# Patient Record
Sex: Female | Born: 1985 | Hispanic: No | Marital: Single | Smoking: Never smoker
Health system: Southern US, Community
[De-identification: ages and names within clinical notes are randomized; demographics above are authoritative.]

## PROBLEM LIST (undated history)

## (undated) DIAGNOSIS — C50919 Malignant neoplasm of unspecified site of unspecified female breast: Secondary | ICD-10-CM

## (undated) DIAGNOSIS — Z9011 Acquired absence of right breast and nipple: Secondary | ICD-10-CM

## (undated) HISTORY — PX: OTHER SURGICAL HISTORY: SHX169

---

## 2021-08-05 ENCOUNTER — Emergency Department (HOSPITAL_COMMUNITY): Payer: BC Managed Care – PPO

## 2021-08-05 ENCOUNTER — Other Ambulatory Visit: Payer: Self-pay

## 2021-08-05 ENCOUNTER — Inpatient Hospital Stay (HOSPITAL_COMMUNITY)
Admission: EM | Admit: 2021-08-05 | Discharge: 2021-08-06 | DRG: 054 | Disposition: A | Discharge status: Other Acute Inpatient Hospital | Payer: BC Managed Care – PPO | Attending: Internal Medicine | Admitting: Internal Medicine

## 2021-08-05 DIAGNOSIS — C7952 Secondary malignant neoplasm of bone marrow: Secondary | ICD-10-CM | POA: Diagnosis present

## 2021-08-05 DIAGNOSIS — C7931 Secondary malignant neoplasm of brain: Secondary | ICD-10-CM | POA: Diagnosis present

## 2021-08-05 DIAGNOSIS — Z79899 Other long term (current) drug therapy: Secondary | ICD-10-CM | POA: Diagnosis not present

## 2021-08-05 DIAGNOSIS — C7951 Secondary malignant neoplasm of bone: Secondary | ICD-10-CM | POA: Diagnosis present

## 2021-08-05 DIAGNOSIS — D61818 Other pancytopenia: Secondary | ICD-10-CM | POA: Diagnosis not present

## 2021-08-05 DIAGNOSIS — G9349 Other encephalopathy: Secondary | ICD-10-CM | POA: Diagnosis present

## 2021-08-05 DIAGNOSIS — C50919 Malignant neoplasm of unspecified site of unspecified female breast: Secondary | ICD-10-CM | POA: Diagnosis present

## 2021-08-05 DIAGNOSIS — E222 Syndrome of inappropriate secretion of antidiuretic hormone: Secondary | ICD-10-CM | POA: Diagnosis present

## 2021-08-05 DIAGNOSIS — R29810 Facial weakness: Secondary | ICD-10-CM | POA: Diagnosis present

## 2021-08-05 DIAGNOSIS — Z9011 Acquired absence of right breast and nipple: Secondary | ICD-10-CM

## 2021-08-05 DIAGNOSIS — Z7952 Long term (current) use of systemic steroids: Secondary | ICD-10-CM | POA: Diagnosis not present

## 2021-08-05 DIAGNOSIS — R4781 Slurred speech: Secondary | ICD-10-CM | POA: Diagnosis present

## 2021-08-05 DIAGNOSIS — Z20822 Contact with and (suspected) exposure to covid-19: Secondary | ICD-10-CM | POA: Diagnosis present

## 2021-08-05 DIAGNOSIS — G8194 Hemiplegia, unspecified affecting left nondominant side: Secondary | ICD-10-CM | POA: Diagnosis not present

## 2021-08-05 DIAGNOSIS — I639 Cerebral infarction, unspecified: Secondary | ICD-10-CM

## 2021-08-05 DIAGNOSIS — D61810 Antineoplastic chemotherapy induced pancytopenia: Secondary | ICD-10-CM | POA: Diagnosis present

## 2021-08-05 HISTORY — DX: Acquired absence of right breast and nipple: Z90.11

## 2021-08-05 HISTORY — DX: Malignant neoplasm of unspecified site of unspecified female breast: C50.919

## 2021-08-05 LAB — CBC
HCT: 25.1 % — ABNORMAL LOW (ref 36.0–46.0)
Hemoglobin: 8 g/dL — ABNORMAL LOW (ref 12.0–15.0)
MCH: 30.5 pg (ref 26.0–34.0)
MCHC: 31.9 g/dL (ref 30.0–36.0)
MCV: 95.8 fL (ref 80.0–100.0)
Platelets: 14 10*3/uL — CL (ref 150–400)
RBC: 2.62 MIL/uL — ABNORMAL LOW (ref 3.87–5.11)
RDW: 17.2 % — ABNORMAL HIGH (ref 11.5–15.5)
WBC: 1.5 10*3/uL — ABNORMAL LOW (ref 4.0–10.5)

## 2021-08-05 LAB — I-STAT BETA HCG BLOOD, ED (MC, WL, AP ONLY): I-stat hCG, quantitative: <5 m[IU]/mL (ref <5)

## 2021-08-05 LAB — COMPREHENSIVE METABOLIC PANEL
ALT: 130 U/L — ABNORMAL HIGH (ref 0–44)
AST: 129 U/L — ABNORMAL HIGH (ref 15–41)
Albumin: 3.2 g/dL — ABNORMAL LOW (ref 3.5–5.0)
Alkaline Phosphatase: 225 U/L — ABNORMAL HIGH (ref 38–126)
Anion gap: 11 (ref 5–15)
BUN: 5 mg/dL — ABNORMAL LOW (ref 6–20)
CO2: 23 mmol/L (ref 22–32)
Calcium: 8.2 mg/dL — ABNORMAL LOW (ref 8.9–10.3)
Chloride: 95 mmol/L — ABNORMAL LOW (ref 98–111)
Creatinine, Ser: 0.49 mg/dL (ref 0.44–1.00)
GFR, Estimated: >60 mL/min (ref >60)
Glucose, Bld: 115 mg/dL — ABNORMAL HIGH (ref 70–99)
Potassium: 3.4 mmol/L — ABNORMAL LOW (ref 3.5–5.1)
Sodium: 129 mmol/L — ABNORMAL LOW (ref 135–145)
Total Bilirubin: 0.9 mg/dL (ref 0.3–1.2)
Total Protein: 6.2 g/dL — ABNORMAL LOW (ref 6.5–8.1)

## 2021-08-05 LAB — I-STAT CHEM 8, ED
BUN: 5 mg/dL — ABNORMAL LOW (ref 6–20)
Calcium, Ion: 1.03 mmol/L — ABNORMAL LOW (ref 1.15–1.40)
Chloride: 93 mmol/L — ABNORMAL LOW (ref 98–111)
Creatinine, Ser: 0.3 mg/dL — ABNORMAL LOW (ref 0.44–1.00)
Glucose, Bld: 112 mg/dL — ABNORMAL HIGH (ref 70–99)
HCT: 25 % — ABNORMAL LOW (ref 36.0–46.0)
Hemoglobin: 8.5 g/dL — ABNORMAL LOW (ref 12.0–15.0)
Potassium: 3.6 mmol/L (ref 3.5–5.1)
Sodium: 130 mmol/L — ABNORMAL LOW (ref 135–145)
TCO2: 25 mmol/L (ref 22–32)

## 2021-08-05 LAB — RESP PANEL BY RT-PCR (FLU A&B, COVID) ARPGX2
Influenza A by PCR: NEGATIVE
Influenza B by PCR: NEGATIVE
SARS Coronavirus 2 by RT PCR: NEGATIVE

## 2021-08-05 LAB — DIFFERENTIAL
Abs Immature Granulocytes: 0.16 10*3/uL — ABNORMAL HIGH (ref 0.00–0.07)
Basophils Absolute: 0 10*3/uL (ref 0.0–0.1)
Basophils Relative: 1 %
Eosinophils Absolute: 0 10*3/uL (ref 0.0–0.5)
Eosinophils Relative: 1 %
Immature Granulocytes: 11 %
Lymphocytes Relative: 19 %
Lymphs Abs: 0.3 10*3/uL — ABNORMAL LOW (ref 0.7–4.0)
Monocytes Absolute: 0.4 10*3/uL (ref 0.1–1.0)
Monocytes Relative: 25 %
Neutro Abs: 0.7 10*3/uL — ABNORMAL LOW (ref 1.7–7.7)
Neutrophils Relative %: 43 %
nRBC: 7 /100 WBC — ABNORMAL HIGH

## 2021-08-05 LAB — APTT: aPTT: 28 seconds (ref 24–36)

## 2021-08-05 LAB — CBG MONITORING, ED: Glucose-Capillary: 109 mg/dL — ABNORMAL HIGH (ref 70–99)

## 2021-08-05 LAB — MRSA NEXT GEN BY PCR, NASAL: MRSA by PCR Next Gen: NOT DETECTED

## 2021-08-05 LAB — MAGNESIUM: Magnesium: 2.2 mg/dL (ref 1.7–2.4)

## 2021-08-05 LAB — PROTIME-INR
INR: 1.1 (ref 0.8–1.2)
Prothrombin Time: 14 seconds (ref 11.4–15.2)

## 2021-08-05 IMAGING — MR MR MRA NECK WO/W CM
1 of 2 series · 18 of 48 positions shown · IV contrast (5.0mL GAD)
Comparison: None

CLINICAL DATA: Neuro deficit, acute, stroke suspected; Transient
ischemic attack (TIA); code stroke for left-sided weakness

EXAM:
MRI HEAD WITHOUT CONTRAST
MRA HEAD WITHOUT CONTRAST
MRA NECK WITHOUT AND WITH CONTRAST
TECHNIQUE: Multiplanar, multi-echo pulse sequences of the brain and surrounding
structures were acquired without intravenous contrast. Angiographic
images of the Circle of Willis were acquired using MRA technique
without intravenous contrast. Angiographic images of the neck were
acquired using MRA technique without and with intravenous contrast.
Carotid stenosis measurements (when applicable) are obtained
utilizing NASCET criteria, using the distal internal carotid
diameter as the denominator.
CONTRAST:  5mL GADAVIST GADOBUTROL 1 MMOL/ML IV SOLN

[Series 1400: cor cemra ft · coronal · 1.2mm · 0.59mm/px · 18 of 105 slices shown]
[im 1/105]
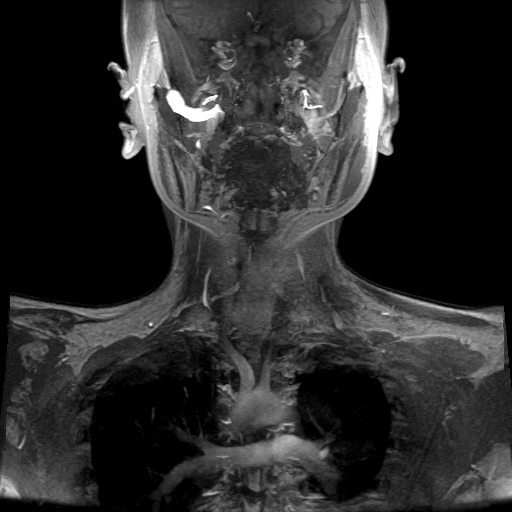
[im 6/105]
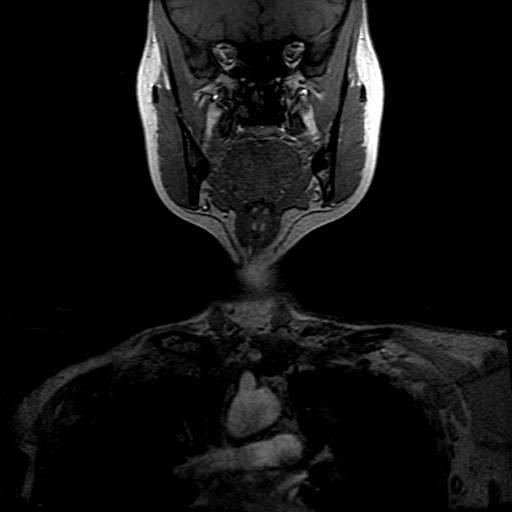
[im 11/105]
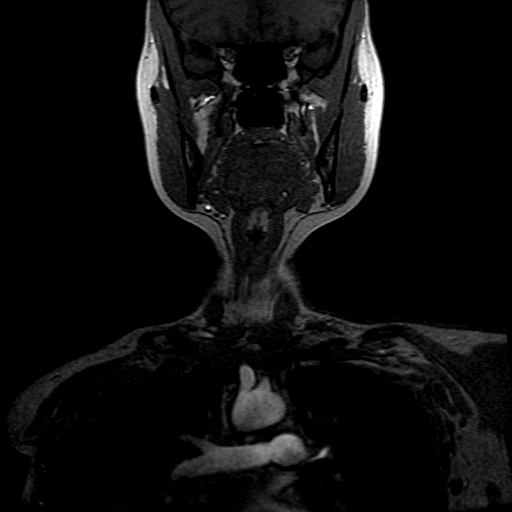
[im 17/105]
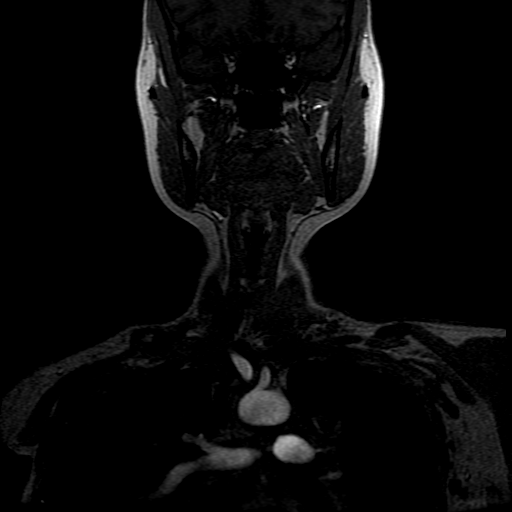
[im 22/105]
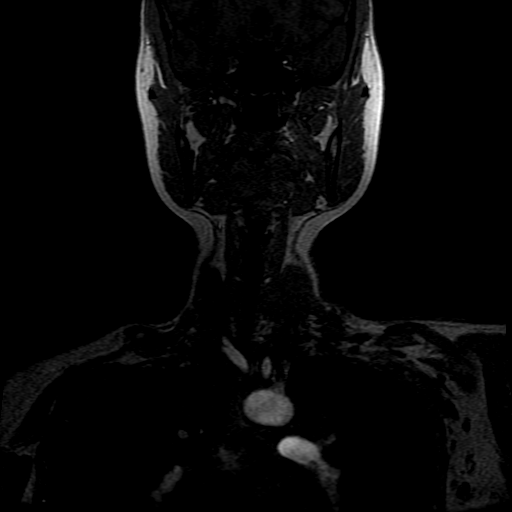
[im 28/105]
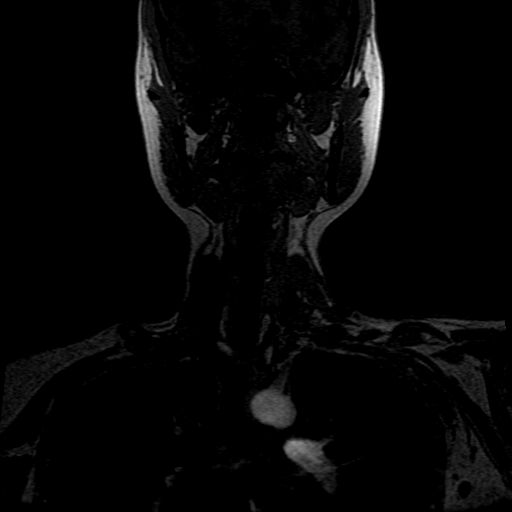
[im 33/105]
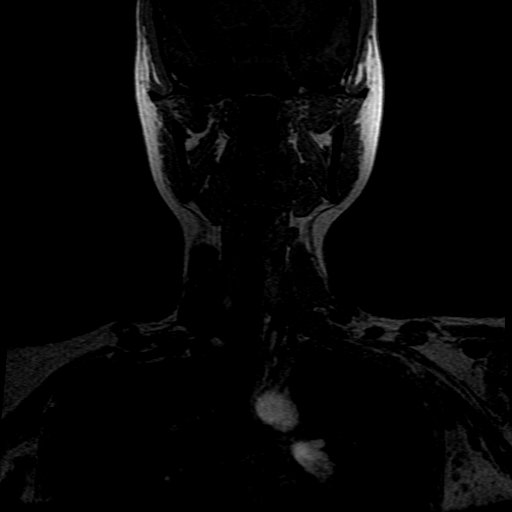
[im 39/105]
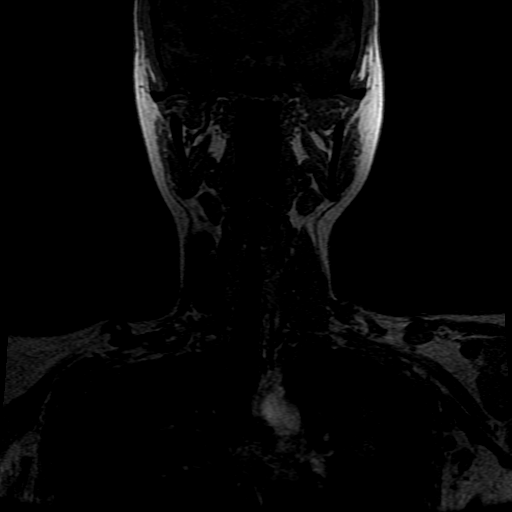
[im 44/105]
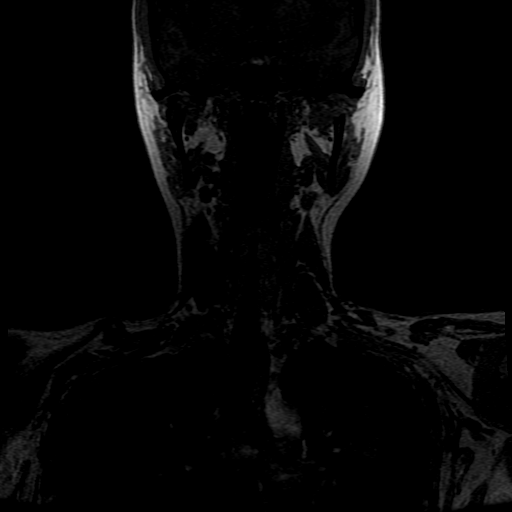
[im 50/105]
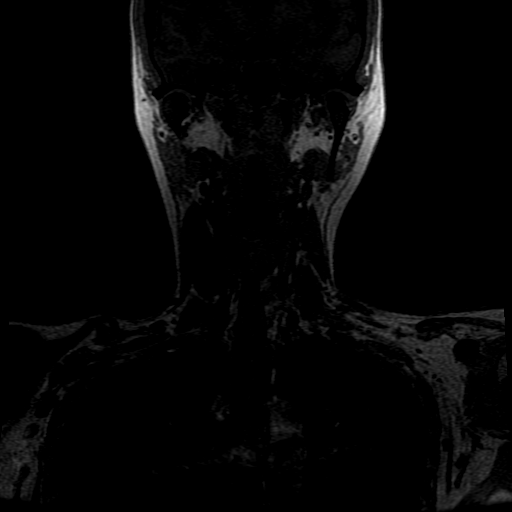
[im 55/105]
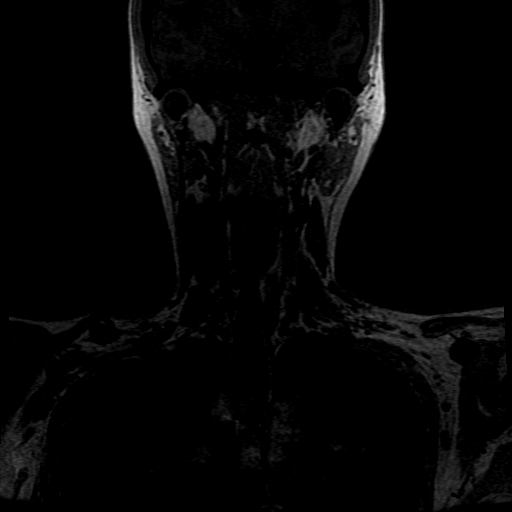
[im 61/105]
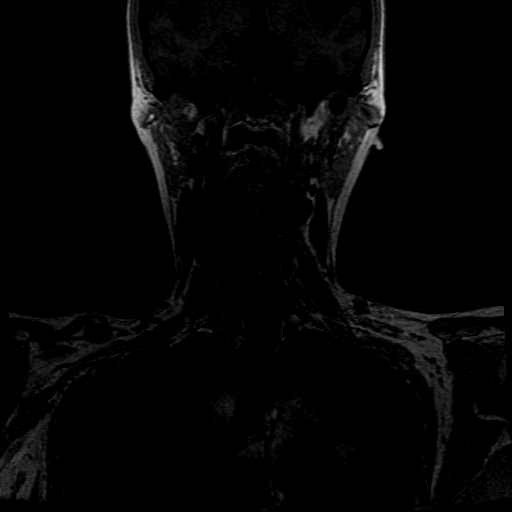
[im 66/105]
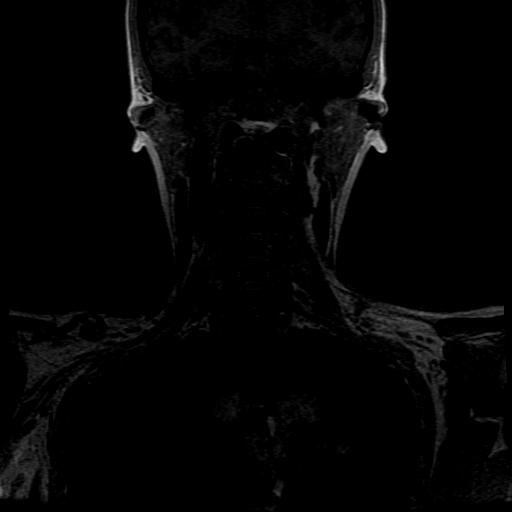
[im 72/105]
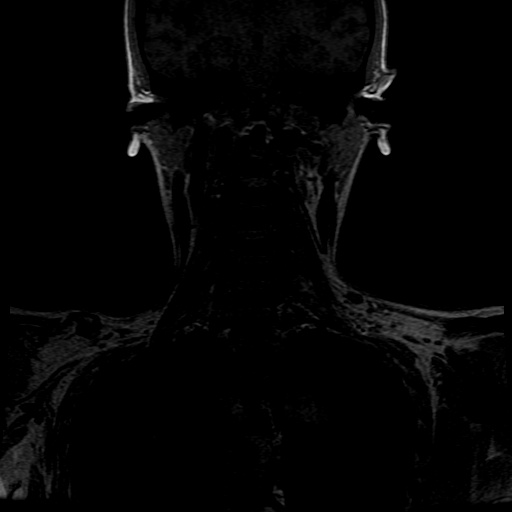
[im 77/105]
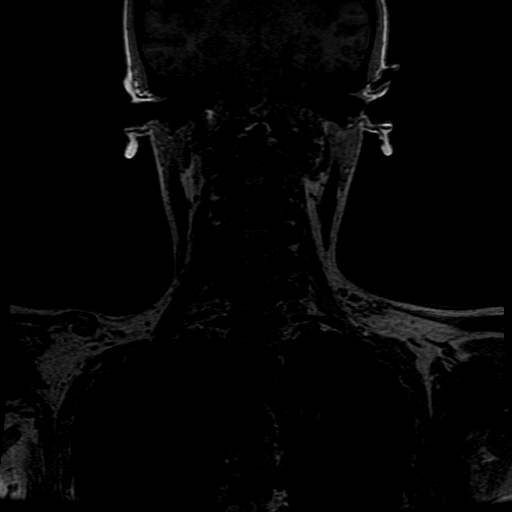
[im 83/105]
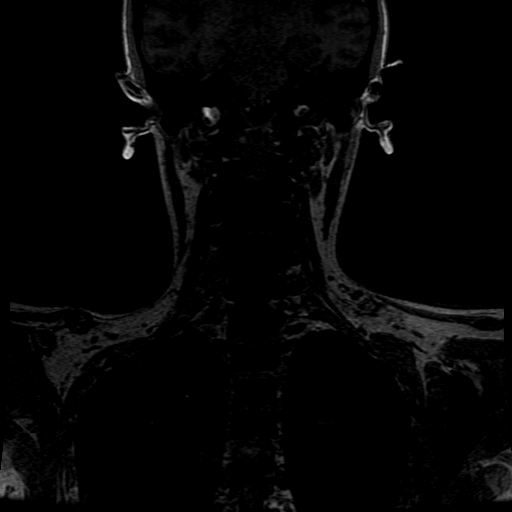
[im 88/105]
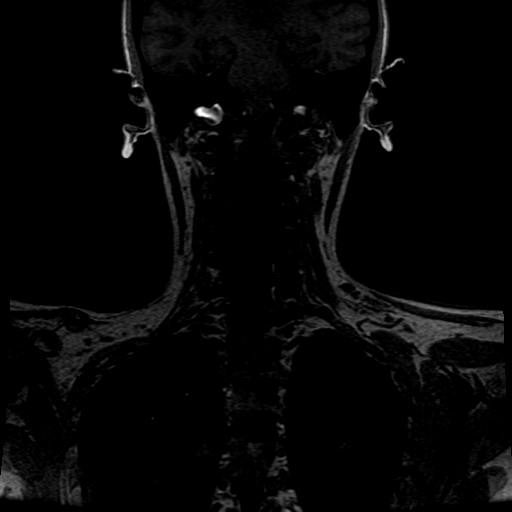
[im 99/105]
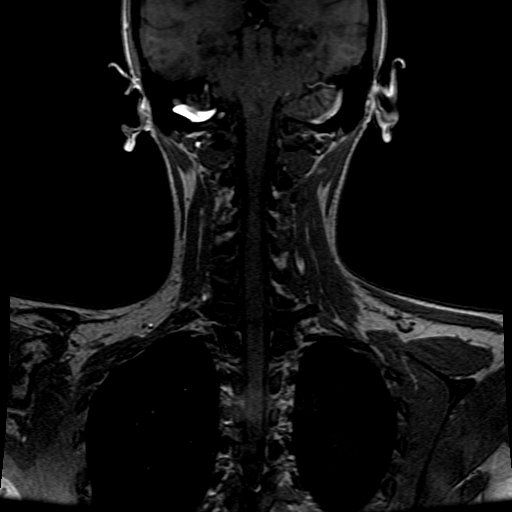

[18 of 48 positions shown; findings below may reference images not displayed]

FINDINGS: MRI HEAD

Brain: There are numerous parenchymal T2 hyperintense lesions with
enhancement involving the gray-white junction, central gray nuclei,
cerebellum, and brainstem. Largest measures 9 mm along the right
caudate. Mild associated edema. Susceptibility is present reflecting
intralesional blood products or mineralization. There is suspected
abnormal cortical leptomeningeal enhancement for example within the
right precentral and central sulci. Some of the cerebellar
enhancement may also be leptomeningeal.

No significant mass effect. There is no hydrocephalus or extra-axial
fluid collection.

Vascular: Major vessel flow voids at the skull base are preserved.

Skull and upper cervical spine: Normal marrow signal is preserved.

Sinuses/Orbits: Paranasal sinuses are aerated. Orbits are
unremarkable.

Other: Sella is unremarkable.  Mastoid air cells are clear.

MRA HEAD

Intracranial internal carotid arteries are patent. Middle and
anterior cerebral arteries are patent. Intracranial vertebral
arteries, basilar artery, posterior cerebral arteries are patent.
Left posterior communicating artery is present. There is no
significant stenosis or aneurysm.

MRA NECK

Common, internal, and external carotid arteries are patent.
Extracranial vertebral arteries are patent. There is no
hemodynamically significant stenosis.

Right lung nodules. Suspect left supraclavicular and mediastinal
adenopathy.
IMPRESSION: Numerous subcentimeter enhancing parenchymal lesions throughout the
brain with mild associated edema. Superimposed abnormal
leptomeningeal enhancement. Incompletely evaluated right lung
nodules and probable left supraclavicular and mediastinal
adenopathy. Advanced metastatic disease is likely.

No acute infarction. No large vessel occlusion or significant
stenosis.

## 2021-08-05 IMAGING — CT CT HEAD CODE STROKE
3 series · 15 of 47 positions shown, 18 images · non-contrast
Comparison: None.

CLINICAL DATA: Code stroke. Neuro deficit, acute, stroke suspected.
Left-sided paralysis.

EXAM:
CT HEAD WITHOUT CONTRAST
TECHNIQUE: Contiguous axial images were obtained from the base of the skull
through the vertex without intravenous contrast.

[Series 3: head 5.0 st · axial · 0.40mm/px · z∈[-68,+58]mm · 9 of 30 slices shown, 12 images]
[im 3/30  brain]
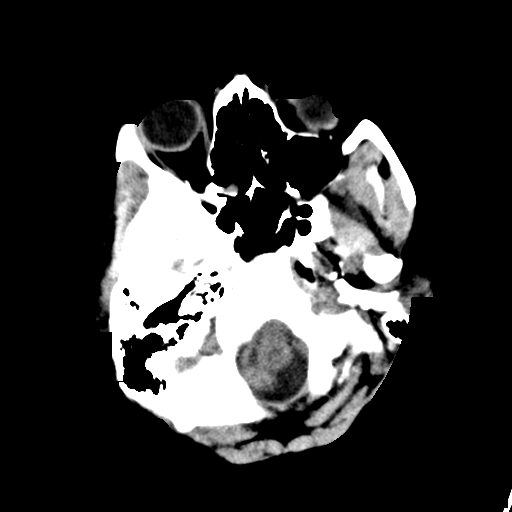
[im 3/30  bone]
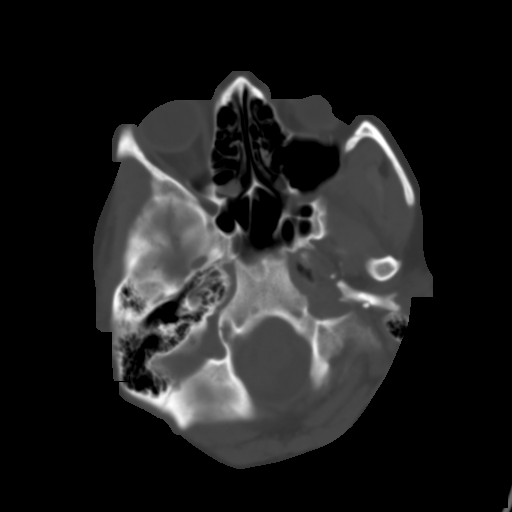
[im 6/30  brain]
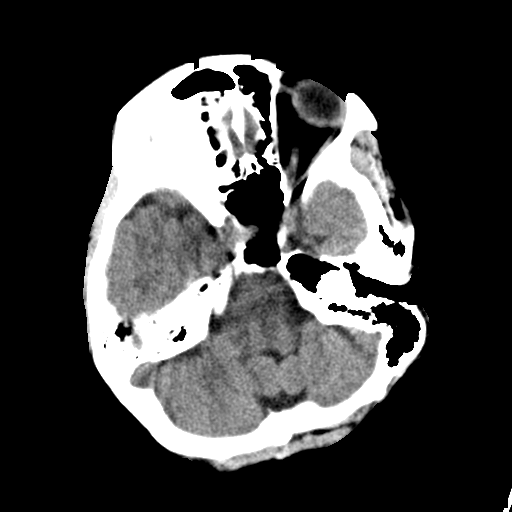
[im 9/30  brain]
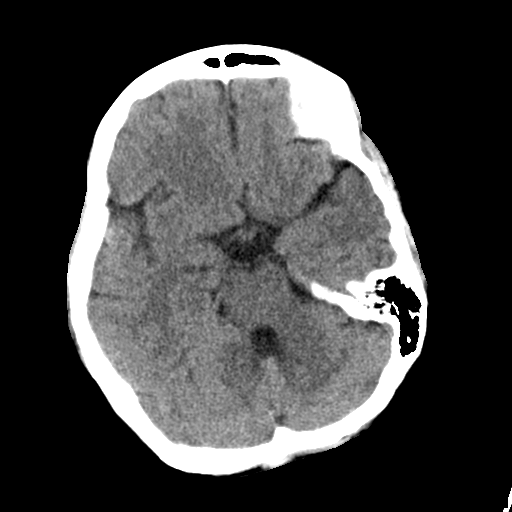
[im 12/30  brain]
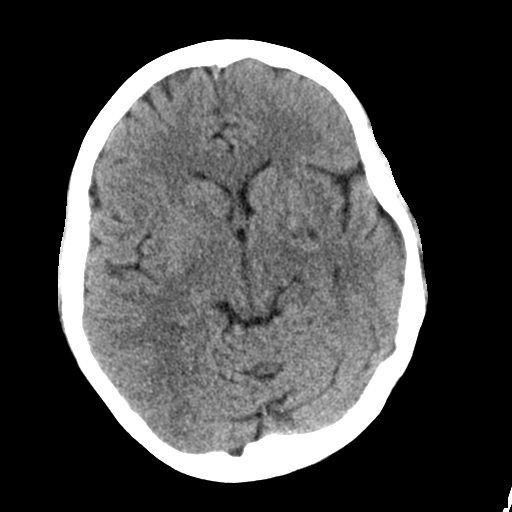
[im 16/30  brain]
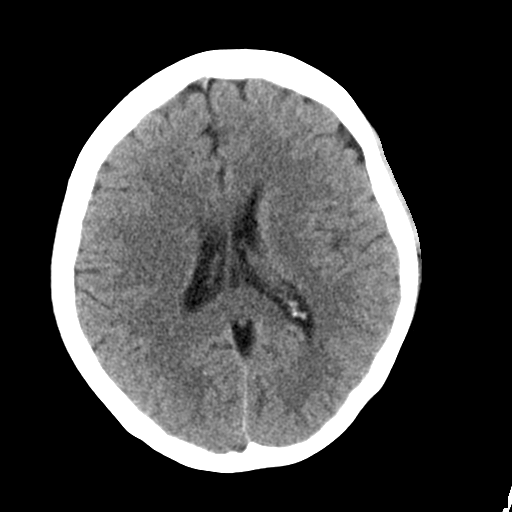
[im 16/30  bone]
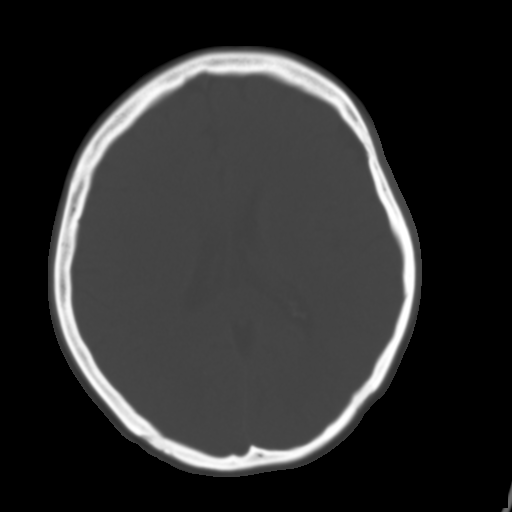
[im 19/30  brain]
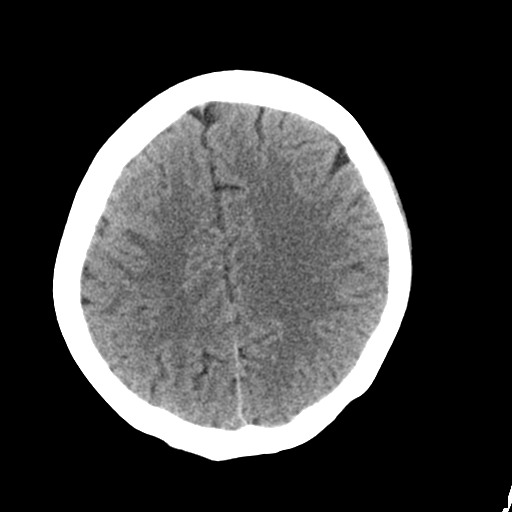
[im 22/30  brain]
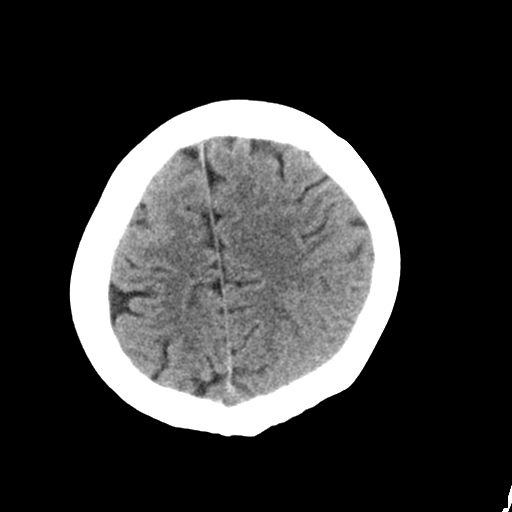
[im 25/30  brain]
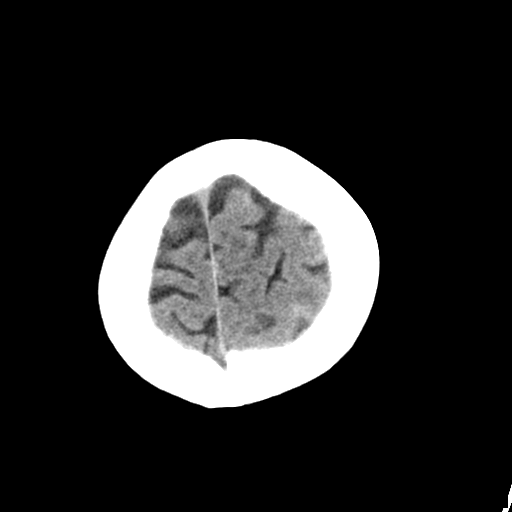
[im 28/30  brain]
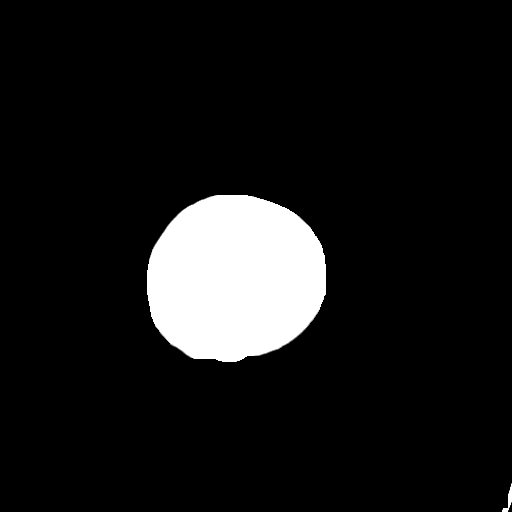
[im 28/30  bone]
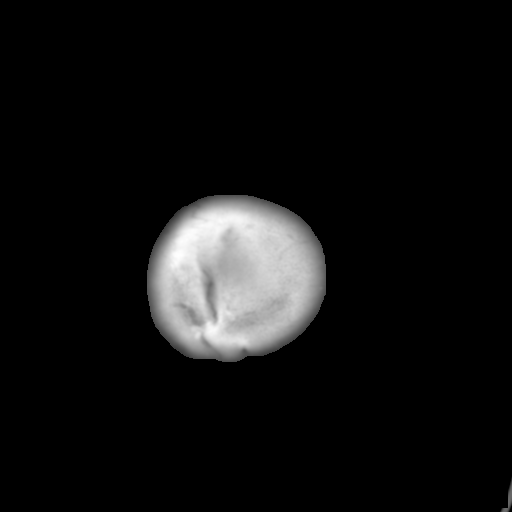

[Series 5: head 3.0 cor st · coronal · 0.34mm/px · 3 of 61 slices shown]
[im 21/61  brain]
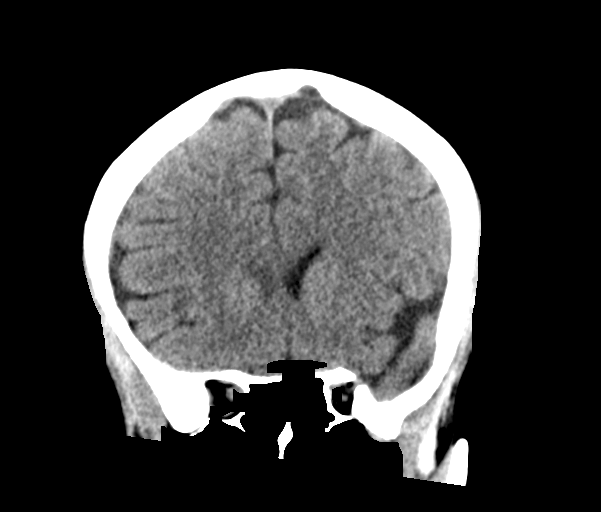
[im 27/61  brain]
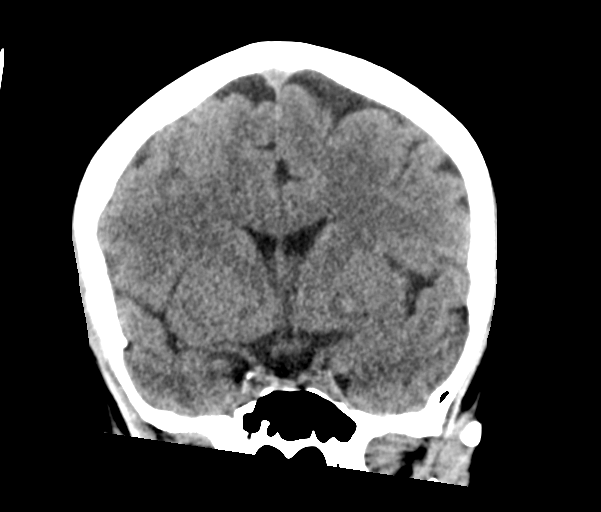
[im 34/61  brain]
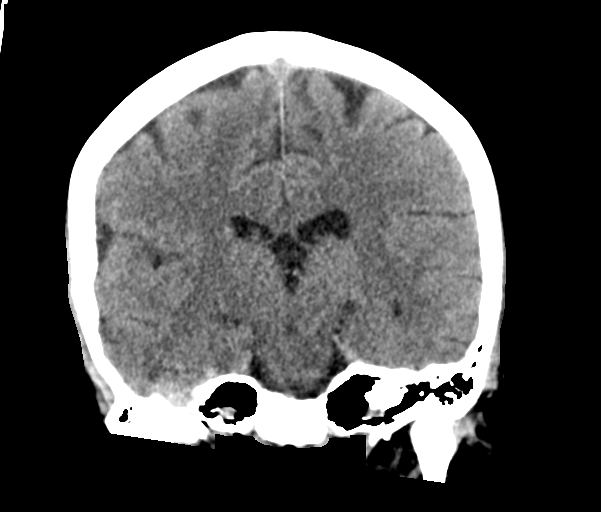

[Series 6: head 3.0 sag st · sagittal · 0.32mm/px · 3 of 56 slices shown]
[im 23/56  brain]
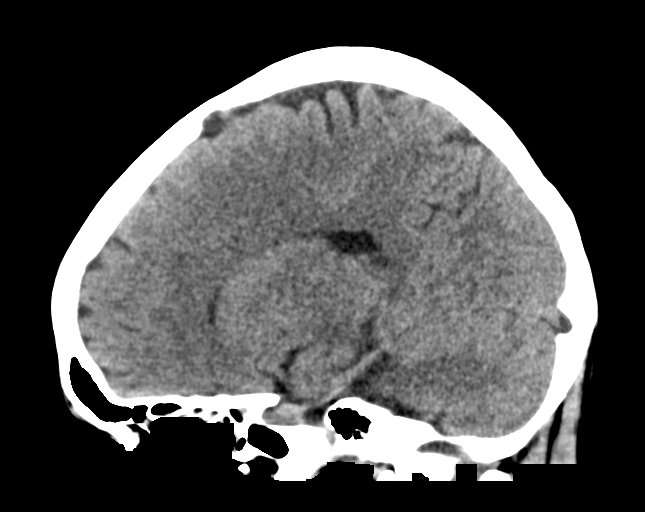
[im 28/56  brain]
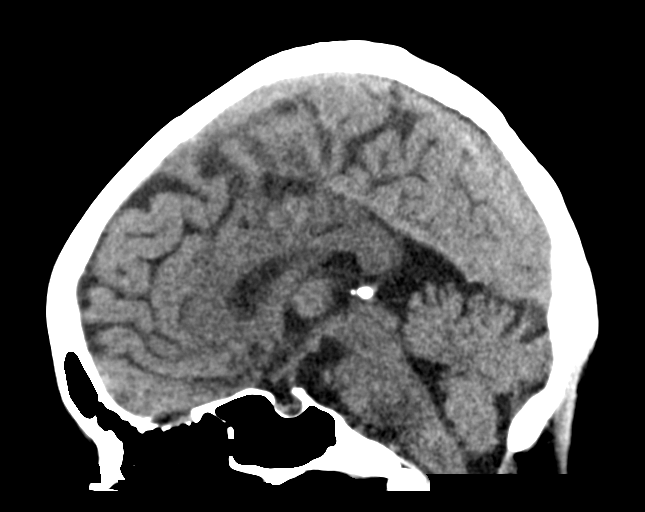
[im 33/56  brain]
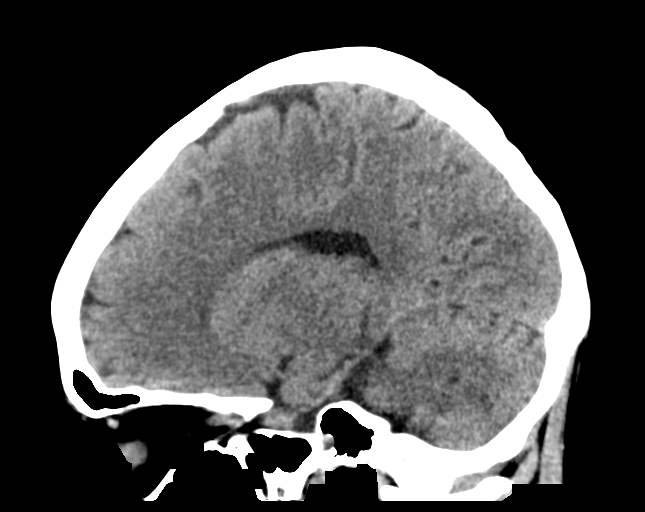

[15 of 47 positions shown; findings below may reference images not displayed]

FINDINGS: Brain: The brain has normal appearance without evidence of atrophy,
old or acute infarction, mass lesion, hemorrhage, hydrocephalus or
extra-axial collection.

Vascular: No abnormal vascular finding.

Skull: Normal

Sinuses/Orbits: Clear/normal

Other: None

ASPECTS ([place] Stroke Program Early CT Score)

- Ganglionic level infarction (caudate, lentiform nuclei, internal
capsule, insula, M1-M3 cortex): 7

- Supraganglionic infarction (M4-M6 cortex): 3

Total score (0-10 with 10 being normal): 10
IMPRESSION: 1. Normal head CT.
2. ASPECTS is 10.
3. These results were communicated to Dr. HANCHI at [DATE] on
[DATE] by text page via the [employer] messaging system.

## 2021-08-05 IMAGING — MR MR HEAD WO/W CM
8 of 19 series · 18 of 48 positions shown · IV contrast (gadavist)
Comparison: None

CLINICAL DATA: Neuro deficit, acute, stroke suspected; Transient
ischemic attack (TIA); code stroke for left-sided weakness

EXAM:
MRI HEAD WITHOUT CONTRAST
MRA HEAD WITHOUT CONTRAST
MRA NECK WITHOUT AND WITH CONTRAST
TECHNIQUE: Multiplanar, multi-echo pulse sequences of the brain and surrounding
structures were acquired without intravenous contrast. Angiographic
images of the Circle of Willis were acquired using MRA technique
without intravenous contrast. Angiographic images of the neck were
acquired using MRA technique without and with intravenous contrast.
Carotid stenosis measurements (when applicable) are obtained
utilizing NASCET criteria, using the distal internal carotid
diameter as the denominator.
CONTRAST:  5mL GADAVIST GADOBUTROL 1 MMOL/ML IV SOLN

[Series 2: DWI · axial · 3.0mm · 0.94mm/px · z∈[-127,+5]mm · 3 of 97 slices shown (1 of 2)]
[im 1/97]
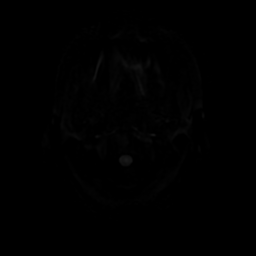
[im 49/97]
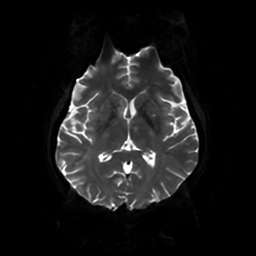
[im 97/97]
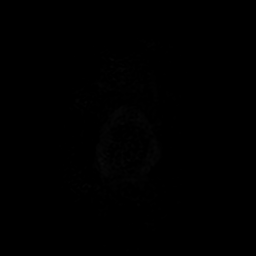

[Series 4: DWI · coronal · 4.0mm · 0.94mm/px · 2 of 64 slices shown (2 of 2)]
[im 1/64]
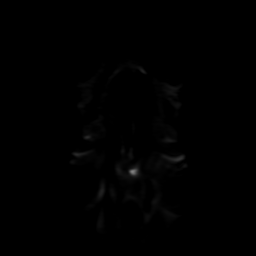
[im 64/64]
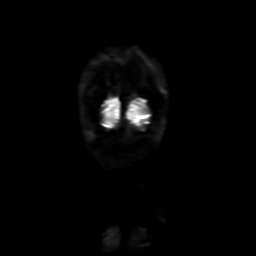

[Series 5: FLAIR · sagittal · 5.0mm · 0.23mm/px · 1 of 23 slices shown (1 of 3)]
[im 1/23]
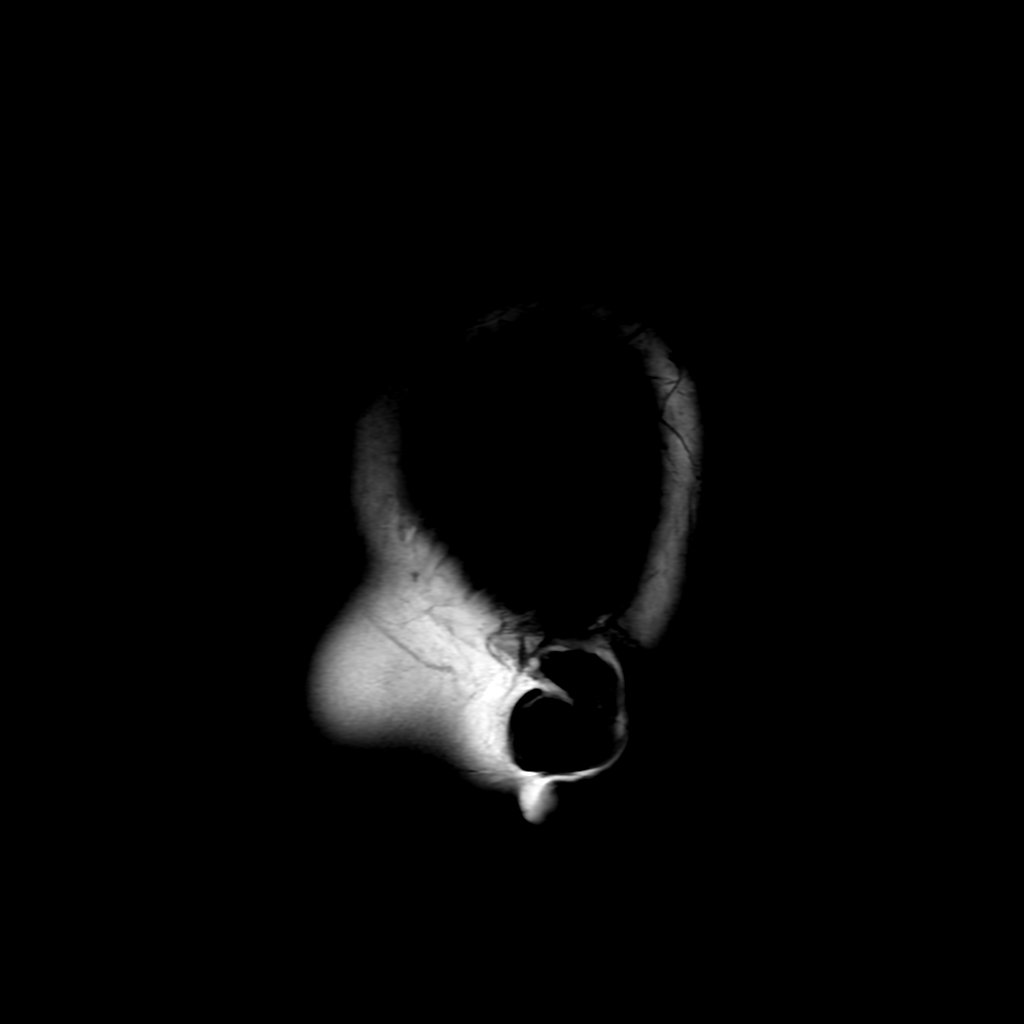

[Series 7: FLAIR · axial · 4.0mm · 0.45mm/px · 1 of 34 slices shown (2 of 3)]
[im 1/34]
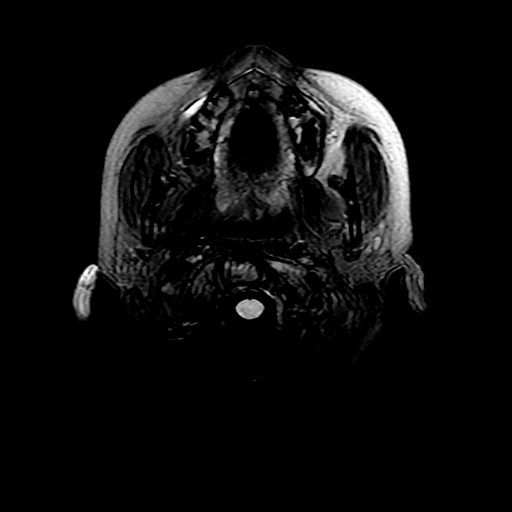

[Series 11: FLAIR · sagittal · 1.6mm · 0.49mm/px · 7 of 228 slices shown (3 of 3)]
[im 1/228]
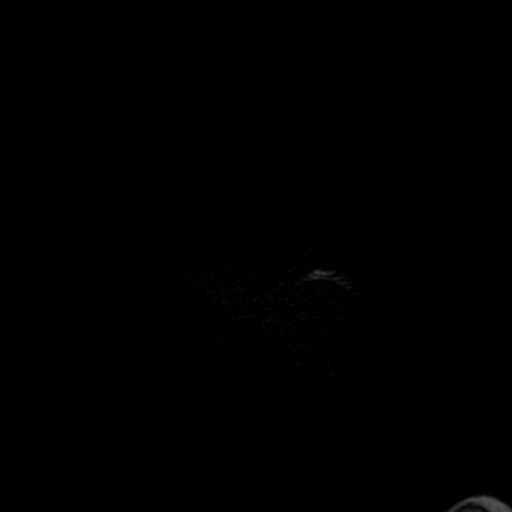
[im 38/228]
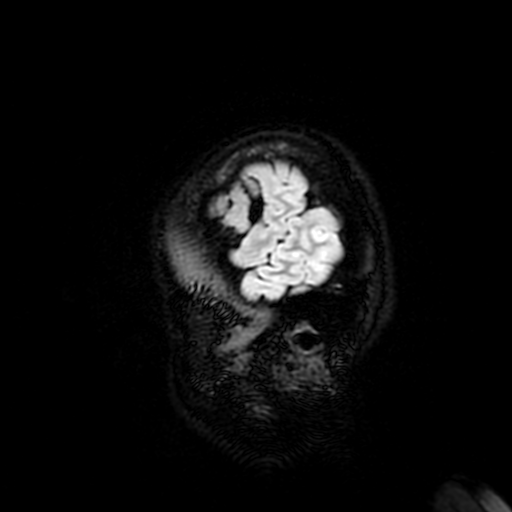
[im 76/228]
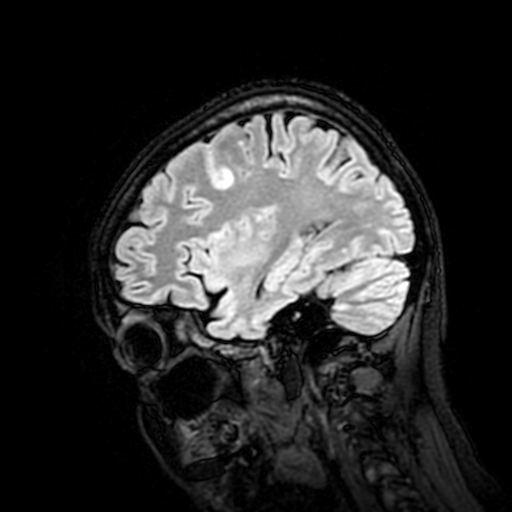
[im 114/228]
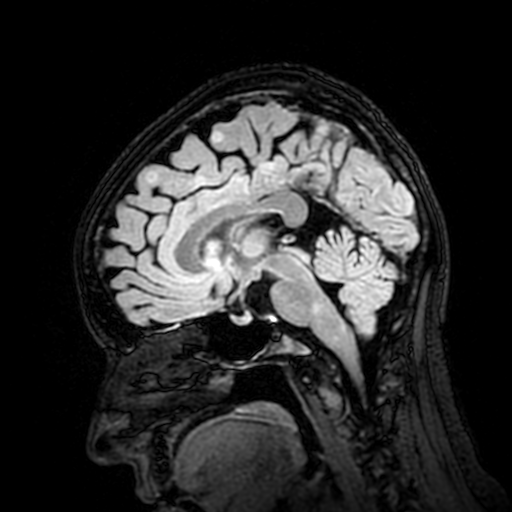
[im 152/228]
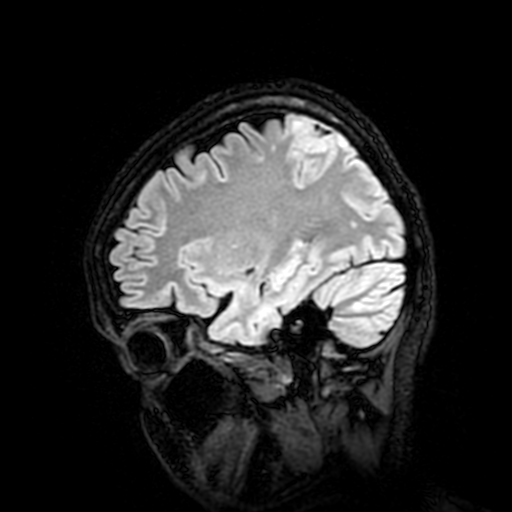
[im 190/228]
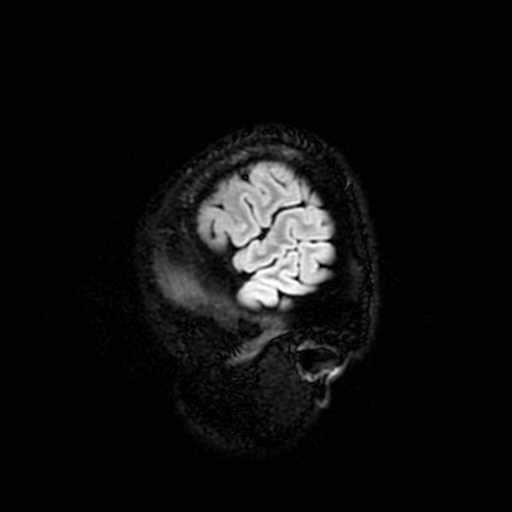
[im 228/228]
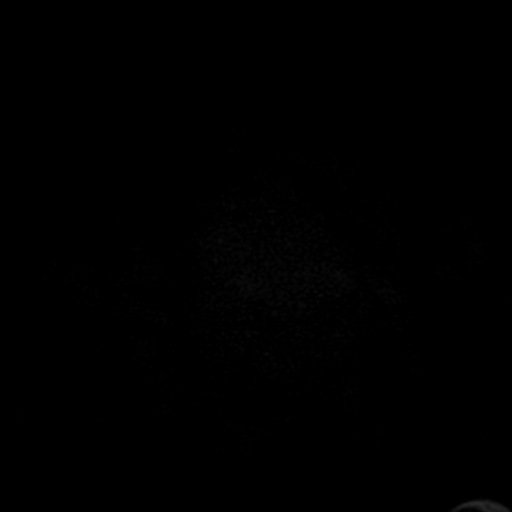

[Series 17: FLAIR post-contrast · sagittal · 5.0mm · 0.23mm/px · 1 of 23 slices shown]
[im 1/23]
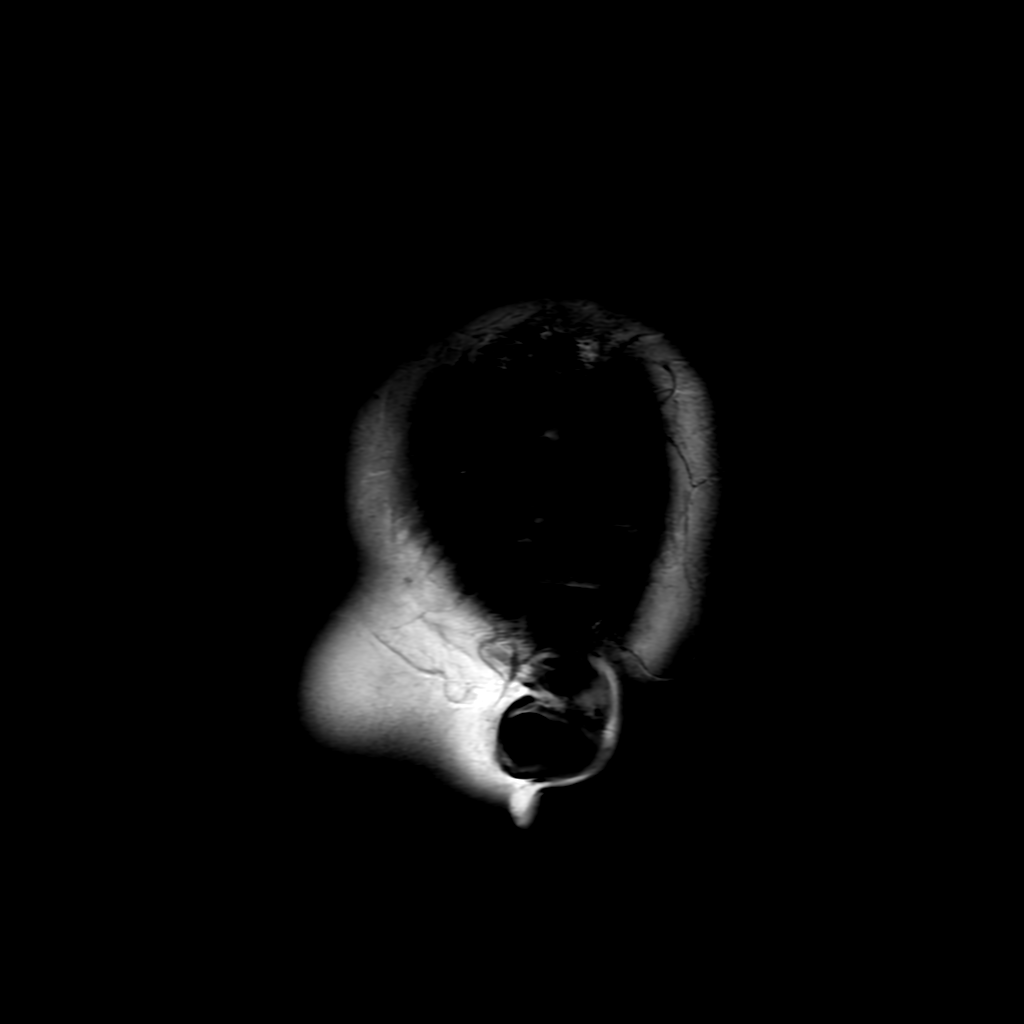

[Series 250: ADC · axial · 3.0mm · 0.94mm/px · z∈[-127,+5]mm · 2 of 50 slices shown (1 of 2)]
[im 1/50]
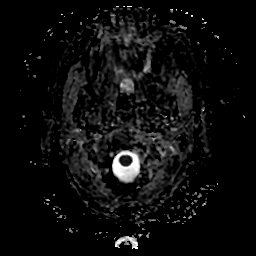
[im 50/50]
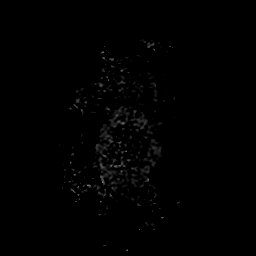

[Series 450: ADC · coronal · 4.0mm · 0.94mm/px · 1 of 28 slices shown (2 of 2)]
[im 1/28]
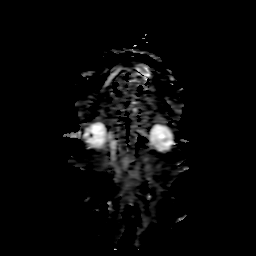

[18 of 48 positions shown; findings below may reference images not displayed]

FINDINGS: MRI HEAD

Brain: There are numerous parenchymal T2 hyperintense lesions with
enhancement involving the gray-white junction, central gray nuclei,
cerebellum, and brainstem. Largest measures 9 mm along the right
caudate. Mild associated edema. Susceptibility is present reflecting
intralesional blood products or mineralization. There is suspected
abnormal cortical leptomeningeal enhancement for example within the
right precentral and central sulci. Some of the cerebellar
enhancement may also be leptomeningeal.

No significant mass effect. There is no hydrocephalus or extra-axial
fluid collection.

Vascular: Major vessel flow voids at the skull base are preserved.

Skull and upper cervical spine: Normal marrow signal is preserved.

Sinuses/Orbits: Paranasal sinuses are aerated. Orbits are
unremarkable.

Other: Sella is unremarkable.  Mastoid air cells are clear.

MRA HEAD

Intracranial internal carotid arteries are patent. Middle and
anterior cerebral arteries are patent. Intracranial vertebral
arteries, basilar artery, posterior cerebral arteries are patent.
Left posterior communicating artery is present. There is no
significant stenosis or aneurysm.

MRA NECK

Common, internal, and external carotid arteries are patent.
Extracranial vertebral arteries are patent. There is no
hemodynamically significant stenosis.

Right lung nodules. Suspect left supraclavicular and mediastinal
adenopathy.
IMPRESSION: Numerous subcentimeter enhancing parenchymal lesions throughout the
brain with mild associated edema. Superimposed abnormal
leptomeningeal enhancement. Incompletely evaluated right lung
nodules and probable left supraclavicular and mediastinal
adenopathy. Advanced metastatic disease is likely.

No acute infarction. No large vessel occlusion or significant
stenosis.

## 2021-08-05 MED ORDER — ACETAMINOPHEN 650 MG RE SUPP: 650.0000 mg | RECTAL | Status: DC | PRN

## 2021-08-05 MED ORDER — ACETAMINOPHEN 160 MG/5ML PO SOLN: 650.0000 mg | ORAL | Status: DC | PRN

## 2021-08-05 MED ORDER — SODIUM CHLORIDE 0.9% FLUSH
3.0000 mL | Freq: Once | INTRAVENOUS | Status: DC
Start: 2021-08-05 — End: 2021-08-07

## 2021-08-05 MED ORDER — POTASSIUM CHLORIDE CRYS ER 20 MEQ PO TBCR
20.0000 meq | EXTENDED_RELEASE_TABLET | Freq: Every day | ORAL | Status: DC
  Administered 2021-08-06: 20 meq via ORAL
  Filled 2021-08-05 (×2): qty 1

## 2021-08-05 MED ORDER — OLANZAPINE 5 MG PO TABS
2.5000 mg | ORAL_TABLET | Freq: Every day | ORAL | Status: DC
  Filled 2021-08-05: qty 1

## 2021-08-05 MED ORDER — CHLORHEXIDINE GLUCONATE CLOTH 2 % EX PADS
6.0000 | MEDICATED_PAD | Freq: Every day | CUTANEOUS | Status: DC
  Administered 2021-08-05: 6 via TOPICAL

## 2021-08-05 MED ORDER — CYCLOBENZAPRINE HCL 5 MG PO TABS: 5.0000 mg | ORAL_TABLET | Freq: Two times a day (BID) | ORAL | Status: DC

## 2021-08-05 MED ORDER — DIPHENHYDRAMINE HCL 50 MG/ML IJ SOLN: 12.5000 mg | Freq: Once | INTRAMUSCULAR | Status: AC

## 2021-08-05 MED ORDER — SENNOSIDES-DOCUSATE SODIUM 8.6-50 MG PO TABS: 1.0000 | ORAL_TABLET | Freq: Every evening | ORAL | Status: DC | PRN

## 2021-08-05 MED ORDER — STROKE: EARLY STAGES OF RECOVERY BOOK: Freq: Once | Status: AC

## 2021-08-05 MED ORDER — HYDROMORPHONE HCL 1 MG/ML IJ SOLN
1.0000 mg | INTRAMUSCULAR | Status: DC | PRN
  Administered 2021-08-06 (×2): 1 mg via INTRAVENOUS
  Filled 2021-08-05 (×2): qty 1

## 2021-08-05 MED ORDER — LEVETIRACETAM IN NACL 500 MG/100ML IV SOLN
500.0000 mg | INTRAVENOUS | Status: DC
  Administered 2021-08-05 – 2021-08-06 (×2): 500 mg via INTRAVENOUS
  Filled 2021-08-05 (×2): qty 100

## 2021-08-05 MED ORDER — HYDROMORPHONE HCL 1 MG/ML IJ SOLN: 1.0000 mg | Freq: Once | INTRAMUSCULAR | Status: DC

## 2021-08-05 MED ORDER — SENNOSIDES-DOCUSATE SODIUM 8.6-50 MG PO TABS
1.0000 | ORAL_TABLET | Freq: Every day | ORAL | Status: DC
  Administered 2021-08-05: 1 via ORAL
  Filled 2021-08-05: qty 1

## 2021-08-05 MED ORDER — ACETAMINOPHEN 325 MG PO TABS: 650.0000 mg | ORAL_TABLET | ORAL | Status: DC | PRN

## 2021-08-05 MED ORDER — PREDNISONE 10 MG PO TABS: 40.0000 mg | ORAL_TABLET | Freq: Every day | ORAL | 0 refills | Status: DC

## 2021-08-05 MED ORDER — SODIUM CHLORIDE 0.9 % IV SOLN: INTRAVENOUS | Status: AC

## 2021-08-05 MED ORDER — SODIUM CHLORIDE 0.9% IV SOLUTION: Freq: Once | INTRAVENOUS | Status: DC

## 2021-08-05 MED ORDER — FAMOTIDINE 20 MG PO TABS
20.0000 mg | ORAL_TABLET | Freq: Two times a day (BID) | ORAL | Status: DC
  Administered 2021-08-05: 20 mg via ORAL
  Filled 2021-08-05: qty 1

## 2021-08-05 MED ORDER — HYDROMORPHONE HCL 1 MG/ML IJ SOLN
1.0000 mg | Freq: Once | INTRAMUSCULAR | Status: DC
Start: 2021-08-05 — End: 2021-08-05

## 2021-08-05 MED ORDER — SODIUM CHLORIDE 0.9 % IV BOLUS
1000.0000 mL | Freq: Once | INTRAVENOUS | Status: AC
  Administered 2021-08-05: 1000 mL via INTRAVENOUS

## 2021-08-05 MED ORDER — HYDROMORPHONE HCL 1 MG/ML IJ SOLN
1.0000 mg | Freq: Once | INTRAMUSCULAR | Status: AC
  Administered 2021-08-05: 1 mg via INTRAVENOUS
  Filled 2021-08-05 (×2): qty 1

## 2021-08-05 MED ORDER — DIPHENHYDRAMINE HCL 50 MG/ML IJ SOLN
INTRAMUSCULAR | Status: AC
  Administered 2021-08-05: 12.5 mg via INTRAVENOUS
  Filled 2021-08-05: qty 1

## 2021-08-05 MED ORDER — GADOBUTROL 1 MMOL/ML IV SOLN
5.0000 mL | Freq: Once | INTRAVENOUS | Status: AC | PRN
  Administered 2021-08-05: 5 mL via INTRAVENOUS

## 2021-08-05 MED ORDER — DEXAMETHASONE SODIUM PHOSPHATE 10 MG/ML IJ SOLN
10.0000 mg | Freq: Once | INTRAMUSCULAR | Status: AC
  Administered 2021-08-05: 10 mg via INTRAVENOUS
  Filled 2021-08-05: qty 1

## 2021-08-05 MED ORDER — DEXAMETHASONE 4 MG PO TABS
4.0000 mg | ORAL_TABLET | Freq: Four times a day (QID) | ORAL | Status: DC
  Administered 2021-08-06 (×3): 4 mg via ORAL
  Filled 2021-08-05 (×2): qty 1
  Filled 2021-08-05: qty 2

## 2021-08-05 NOTE — Consult Note (Addendum)
NEURO HOSPITALIST CONSULT NOTE   Requesting physician: Dr. Langston Masker  Reason for Consult: Left sided weakness and sensory numbness  History obtained from:  Patient, EMS and Chart     HPI:                                                                                                                                          Shelley Pena is an 35 y.o. female with a history of breast cancer s/p right mastectomy 1 year ago, currently receiving IV chemotherapy, who presents as a Code Stroke for acute onset of left sided weakness, confusion, slurred speech and left facial droop. LKN was 0600. The patient and her family first noticed the left sided deficits at 80. On EMS arrival, the same deficits were noted. BP was 140/80.   The patient complains of a moderate bifrontal headache. No vision loss.   PMHx Breast cancer s/p right mastectomy  No family history on file.          Social History:  has no history on file for tobacco use, alcohol use, and drug use.  No Known Allergies  MEDICATIONS:                                                                                                                     Medications list not in Epic   ROS:                                                                                                                                       As per HPI. The patient denies any additional complaints.    Blood pressure 123/90, pulse (!) 112, temperature 97.6 F (36.4 C),  temperature source Oral, resp. rate 16, height 5\' 3"  (1.6 m), weight 53.2 kg, SpO2 100 %.   General Examination:                                                                                                       Physical Exam  HEENT-  [place]/AT    Lungs Respirations unlabored Extremities- No edema  Neurological Examination Mental Status: Awake, with mildly decreased level of alertness. Appears slightly sedated versus drowsy. Flattened/dysthymic affect. Speech fluent  with intact comprehension, repetition and naming. No dysarthria noted. Slightly increased latencies of verbal and motor responses. Oriented to the city, the state, the day, "November" and "2020".   Cranial Nerves: II: Visual fields intact bilaterally. No extinction to DSS. PERRL.  III,IV, VI:  Left eyelid closure weakness and lag in movement relative to the right is noted, but is able to fully close. Right eyelid normal. Gaze is conjugate. EOMI horizontally and vertically. No nystagmus.  V: Temp and FT sensation equal bilaterally. No extinction to DSS.  VII: Mild weakness of left perioral musculature.  VIII: Hearing intact to voice IX,X: No hoarseness. Mildly hypophonic speech. Palate elevates equally. Uvula midline.  XI: Symmetric shoulder shrug XII: Midline tongue extension Motor: RUE and RLE: 5/5 proximally and distally LUE 4+/5 proximally and distally LLE: 4+/5 proximally and distally [place] of LUE with testing of [place]; LUE held about 1 inch lower than RUE during testing, but does not continue to drift.  Left arm movement < right with orbiting fingers test Sensory: Temp and FT sensation equal bilaterally, UEs and LEs. No extinction to DSS.  Deep Tendon Reflexes: 2+ and symmetric throughout Plantars: Right: downgoing   Left: downgoing Cerebellar: No ataxia with FNF bilaterally  Gait: Able to stand and walk 2 steps during transfer, but slow and with decreased stride length.    Lab Results: Basic Metabolic Panel: Recent Labs  Lab 08/05/21 0834  NA 130*  K 3.6  CL 93*  GLUCOSE 112*  BUN 5*  CREATININE 0.30*    CBC: Recent Labs  Lab 08/05/21 0834  HGB 8.5*  HCT 25.0*    Cardiac Enzymes: No results for input(s): CKTOTAL, CKMB, CKMBINDEX, TROPONINI in the last 168 hours.  Lipid Panel: No results for input(s): CHOL, TRIG, HDL, CHOLHDL, VLDL, LDLCALC in the last 168 hours.  Imaging: CT HEAD CODE STROKE WO CONTRAST  Result Date: 08/05/2021 CLINICAL DATA:  Code stroke.  Neuro deficit, acute, stroke suspected. Left-sided paralysis. EXAM: CT HEAD WITHOUT CONTRAST TECHNIQUE: Contiguous axial images were obtained from the base of the skull through the vertex without intravenous contrast. COMPARISON:  None. FINDINGS: Brain: The brain has normal appearance without evidence of atrophy, old or acute infarction, mass lesion, hemorrhage, hydrocephalus or extra-axial collection. Vascular: No abnormal vascular finding. Skull: Normal Sinuses/Orbits: Clear/normal Other: None ASPECTS ([place] Stroke Program Early CT Score) - Ganglionic level infarction (caudate, lentiform nuclei, internal capsule, insula, M1-M3 cortex): 7 - Supraganglionic infarction (M4-M6 cortex): 3 Total score (0-10 with 10 being normal): 10 IMPRESSION: 1. Normal head CT. 2. ASPECTS is 10. 3.  These results were communicated to Dr. Cheral Marker at 8:41 am on 08/05/2021 by text page via the [facility] messaging system. Electronically Signed   By: Shelley Pena M.D.   On: 08/05/2021 08:42     Assessment: 35 year old female presenting with acute onset of left face, arm and leg weakness with numnbess 1. Exam reveals weakness of left eyelid closure, mild weakness of left perioral muscles and mild left upper and lower extremity weakness 2. CT head: Normal.  3. Localization: Cranial nerve exam findings are suggestive of a left central or peripheral CN7 lesion. There is also mild LUE and LLE (ipsilateral) weakness, which would be on the side opposite of the expected weakness in the case of a potential pontine stroke affecting the facial motor nucleus. Given the unusual exam findings, will need an MRI brain with and without contrast to further assess.  4. The patient is not on a blood thinner and has no definite contraindications to TNK in the context of pending platelet count. After discussion of TNK risks relative to an approximately 30% chance of significant improvement of her deficits, the patient stated that she is not willing to take  the risk of possible ICH with TNK administration. 5. Clinical exam findings are not consistent with LVO.   Recommendations: 1. MRI brain with and without contrast.  2. MRA head 3. MRA neck with and without contrast. 4. Will need Pharmacy assistance to obtain patient's full home medication list, which is not currently in [place].  5. Further recommendations pending MRI results.   Addendum: MRI brain: Numerous subcentimeter enhancing parenchymal lesions throughout the brain with mild associated edema. Superimposed abnormal leptomeningeal enhancement. No acute infarction.   MRA head: No large vessel occlusion or significant stenosis.  MRA neck: No LVO or significant stenosis. Incompletely evaluated right lung nodules and probable left supraclavicular and mediastinal adenopathy. Advanced metastatic disease is likely.    Electronically signed: Dr. Kerney Elbe 08/05/2021, 9:00 AM

## 2021-08-05 NOTE — Discharge Instructions (Addendum)
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  This likely was causing your symptoms and your weakness today.  We have called and spoken to your oncologist at [facility], and are making arrangements for very close follow-up.  In the meantime he will need to be started on steroids at home to help with the swelling of the brain.  Your next dose of prednisone would be tomorrow morning, 40 mg by mouth with breakfast.  If this medication is causing serious side effects, including very fast heart rate, lightheadedness, shortness of breath, or other serious medical concerns, stop taking this medication and call your oncologist.  You should discuss with your oncologist at your next visit if they wish to change your prednisone dosing or duration of treatment.  If you develop sudden worsening headache, new stroke-like symptoms, or have a seizure at home, please call 911 and return to the ER immediately.  Please reach out [facility] (Dr. Wetzel Bjornstad) tomorrow to confirm that follow up visit.  You should be seen within the next day or two.

## 2021-08-05 NOTE — ED Provider Notes (Signed)
  Physical Exam  BP (!) 119/94   Pulse (!) 121   Temp 97.6 F (36.4 C) (Oral)   Resp 20   Ht 5\' 3"  (1.6 m)   Wt 53.2 kg   SpO2 100%   BMI 20.78 kg/m     ED Course/Procedures   Procedures  MDM     35 y.o. female here with onset of left sided weakness, confusion, slurred speech, pt reporting this started around 0700 (she woke up feeling normal this morning).  Had a headache that has improved.  Arrives as a CODE STROKE. 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 [facility] paged to discuss urgent rapid follow-up tomorrow. Currently asymptomatic. [facility] put in for bed request in case rapid outpatient follow-up not able to be achieved. Got Decadron, hx of tachycardia with steroids. Watch and plan for direct admission to [facility] if develops worsening tachycardia.   Spoke with Dr. Fanny Skates at [facility] who stated that there are currently no beds available at [facility].  The patient will remain on a wait list for transfer.  Did recommend admission given the patient's thrombocytopenia and pancytopenia and new brain metastasis.  If admitting to [facility], did recommend radiation oncology consultation.  Spoke with patient family regarding waiting for bed at [facility] versus admission to the [facility] system and they are comfortable with [facility] admission.  Hospitalist medicine consulted for admission.  Radiation oncology also consulted for admission.  Plan will be to admit the patient to [facility] for further care and management.   Regan Lemming, MD 08/06/21 [phone]

## 2021-08-05 NOTE — ED Notes (Signed)
Correction: Pt refused to go to MRI. Family in room with pt, requesting for pt to be discharged, family stated they originally wanted pt to go to [facility] but [facility] advised them pt might not be stable for transport to [facility] and was brought here. RN notified.

## 2021-08-05 NOTE — H&P (Addendum)
History and Physical    Shelley Pena IOE:[id number] DOB: November 14, 1985 DOA: 08/05/2021  PCP: Pcp, No (Confirm with patient/family/NH records and if not entered, this has to be entered at [facility] point of entry) Patient coming from: Home  I have personally briefly reviewed patient's old medical records in [place]  Chief Complaint: Left-sided weakness, slurred speech and facial droop  HPI: Shelley Pena is a 35 y.o. 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  Patient was last seen normal ROM 6:00 this morning, then patient woke up and complaining about left-sided weakness.  Family noticed patient was confused and developed left facial droop along with slurred speech.  At the time I saw the patient, patient remained confused.  Most history provided by patient brother-in-law at bedside. ED Course: MRI showed numerous subcentimeter enhancing parenchymal lesions throughout the brain with associated edema.  [facility] contacted, accepted patient for transfer however due to bed availability, [facility] unable to take patient today.  Review of Systems: Unable to perform, patient confused.  No past medical history on file.   has no history on file for tobacco use, alcohol use, and drug use.  No Known Allergies  No family history on file.   Prior to Admission medications   Medication Sig Start Date End Date Taking? Authorizing Provider  cyclobenzaprine (FLEXERIL) 5 MG tablet Take 5 mg by mouth 2 (two) times daily.   Yes [provider]  famotidine (PEPCID) 20 MG tablet Take 20 mg by mouth 2 (two) times daily before a meal.   Yes [provider]  HYDROmorphone (DILAUDID) 2 MG tablet Take 2-4 mg by mouth every 4 (four) hours as needed (for pain).   Yes [provider]  OLANZapine (ZYPREXA) 2.5 MG tablet Take 2.5 mg by  mouth at bedtime.   Yes [provider]  potassium chloride SA (KLOR-CON) 20 MEQ tablet Take 20 mEq by mouth daily.   Yes [provider]  predniSONE (DELTASONE) 10 MG tablet Take 4 tablets (40 mg total) by mouth daily with breakfast for 10 days. 08/05/21 08/15/21 Yes Trifan, Carola Rhine, MD  senna-docusate (STOOL SOFTENER PLUS LAXATIVE) 8.6-50 MG tablet Take 1 tablet by mouth daily.   Yes [provider]    Physical Exam: Vitals:   08/05/21 1459 08/05/21 1500 08/05/21 1600 08/05/21 1700  BP: (!) 117/96 (!) 119/94 (!) 123/97 (!) 123/97  Pulse: (!) 122 (!) 121 (!) 113 (!) 107  Resp: 20 20 18 18   Temp:      TempSrc:      SpO2: 100% 100% 99% 99%  Weight:      Height:        Constitutional: NAD, calm, comfortable Vitals:   08/05/21 1459 08/05/21 1500 08/05/21 1600 08/05/21 1700  BP: (!) 117/96 (!) 119/94 (!) 123/97 (!) 123/97  Pulse: (!) 122 (!) 121 (!) 113 (!) 107  Resp: 20 20 18 18   Temp:      TempSrc:      SpO2: 100% 100% 99% 99%  Weight:      Height:       Eyes: PERRL, lids and conjunctivae normal ENMT: Mucous membranes are moist. Posterior pharynx clear of any exudate or lesions.Normal dentition.  Neck: normal, supple, no masses, no thyromegaly Respiratory: clear to auscultation bilaterally, no wheezing, no crackles. Normal respiratory effort. No accessory muscle use.  Cardiovascular: Regular  rate and rhythm, no murmurs / rubs / gallops. No extremity edema. 2+ pedal pulses. No carotid bruits.  Abdomen: no tenderness, no masses palpated. No hepatosplenomegaly. Bowel sounds positive.  Musculoskeletal: no clubbing / cyanosis. No joint deformity upper and lower extremities. Good ROM, no contractures. Normal muscle tone.  Skin: no rashes, lesions, ulcers. No induration Neurologic: Slight left-sided facial droop.  Decreased light touch sensation left side, DTR normal. Strength 4/5 on left arm and leg compared to 5/5 on the right side.  Psychiatric: Awake,  confused.     Labs on Admission: I have personally reviewed following labs and imaging studies  CBC: Recent Labs  Lab 08/05/21 0832 08/05/21 0834  WBC 1.5*  --   NEUTROABS 0.7*  --   HGB 8.0* 8.5*  HCT 25.1* 25.0*  MCV 95.8  --   PLT 14*  --    Basic Metabolic Panel: Recent Labs  Lab 08/05/21 0832 08/05/21 0834  NA 129* 130*  K 3.4* 3.6  CL 95* 93*  CO2 23  --   GLUCOSE 115* 112*  BUN 5* 5*  CREATININE 0.49 0.30*  CALCIUM 8.2*  --    GFR: Estimated Creatinine Clearance: 82.7 mL/min (A) (by C-G formula based on SCr of 0.3 mg/dL (L)). Liver Function Tests: Recent Labs  Lab 08/05/21 0832  AST 129*  ALT 130*  ALKPHOS 225*  BILITOT 0.9  PROT 6.2*  ALBUMIN 3.2*   No results for input(s): LIPASE, AMYLASE in the last 168 hours. No results for input(s): AMMONIA in the last 168 hours. Coagulation Profile: Recent Labs  Lab 08/05/21 0832  INR 1.1   Cardiac Enzymes: No results for input(s): CKTOTAL, CKMB, CKMBINDEX, TROPONINI in the last 168 hours. BNP (last 3 results) No results for input(s): PROBNP in the last 8760 hours. HbA1C: No results for input(s): HGBA1C in the last 72 hours. CBG: Recent Labs  Lab 08/05/21 0829  GLUCAP 109*   Lipid Profile: No results for input(s): CHOL, HDL, LDLCALC, TRIG, CHOLHDL, LDLDIRECT in the last 72 hours. Thyroid Function Tests: No results for input(s): TSH, T4TOTAL, FREET4, T3FREE, THYROIDAB in the last 72 hours. Anemia Panel: No results for input(s): VITAMINB12, FOLATE, FERRITIN, TIBC, IRON, RETICCTPCT in the last 72 hours. Urine analysis: No results found for: COLORURINE, APPEARANCEUR, LABSPEC, [place], GLUCOSEU, [place], [place], [place], PROTEINUR, UROBILINOGEN, NITRITE, LEUKOCYTESUR  Radiological Exams on Admission: MR ANGIO HEAD WO CONTRAST  Result Date: 08/05/2021 CLINICAL DATA:  Neuro deficit, acute, stroke suspected; Transient ischemic attack (TIA); code stroke for left-sided weakness EXAM: MRI HEAD  WITHOUT CONTRAST MRA HEAD WITHOUT CONTRAST MRA NECK WITHOUT AND WITH CONTRAST TECHNIQUE: Multiplanar, multi-echo pulse sequences of the brain and surrounding structures were acquired without intravenous contrast. Angiographic images of the Circle of Willis were acquired using MRA technique without intravenous contrast. Angiographic images of the neck were acquired using MRA technique without and with intravenous contrast. Carotid stenosis measurements (when applicable) are obtained utilizing NASCET criteria, using the distal internal carotid diameter as the denominator. CONTRAST:  27mL GADAVIST GADOBUTROL 1 MMOL/ML IV SOLN COMPARISON:  None FINDINGS: MRI HEAD Brain: There are numerous parenchymal T2 hyperintense lesions with enhancement involving the gray-white junction, central gray nuclei, cerebellum, and brainstem. Largest measures 9 mm along the right caudate. Mild associated edema. Susceptibility is present reflecting intralesional blood products or mineralization. There is suspected abnormal cortical leptomeningeal enhancement for example within the right precentral and central sulci. Some of the cerebellar enhancement may also be leptomeningeal. No significant mass effect. There is no  hydrocephalus or extra-axial fluid collection. Vascular: Major vessel flow voids at the skull base are preserved. Skull and upper cervical spine: Normal marrow signal is preserved. Sinuses/Orbits: Paranasal sinuses are aerated. Orbits are unremarkable. Other: Sella is unremarkable.  Mastoid air cells are clear. MRA HEAD Intracranial internal carotid arteries are patent. Middle and anterior cerebral arteries are patent. Intracranial vertebral arteries, basilar artery, posterior cerebral arteries are patent. Left posterior communicating artery is present. There is no significant stenosis or aneurysm. MRA NECK Common, internal, and external carotid arteries are patent. Extracranial vertebral arteries are patent. There is no  hemodynamically significant stenosis. Right lung nodules. Suspect left supraclavicular and mediastinal adenopathy. IMPRESSION: Numerous subcentimeter enhancing parenchymal lesions throughout the brain with mild associated edema. Superimposed abnormal leptomeningeal enhancement. Incompletely evaluated right lung nodules and probable left supraclavicular and mediastinal adenopathy. Advanced metastatic disease is likely. No acute infarction. No large vessel occlusion or significant stenosis. Electronically Signed   By: Macy Mis M.D.   On: 08/05/2021 12:16   MR ANGIO NECK W WO CONTRAST  Result Date: 08/05/2021 CLINICAL DATA:  Neuro deficit, acute, stroke suspected; Transient ischemic attack (TIA); code stroke for left-sided weakness EXAM: MRI HEAD WITHOUT CONTRAST MRA HEAD WITHOUT CONTRAST MRA NECK WITHOUT AND WITH CONTRAST TECHNIQUE: Multiplanar, multi-echo pulse sequences of the brain and surrounding structures were acquired without intravenous contrast. Angiographic images of the Circle of Willis were acquired using MRA technique without intravenous contrast. Angiographic images of the neck were acquired using MRA technique without and with intravenous contrast. Carotid stenosis measurements (when applicable) are obtained utilizing NASCET criteria, using the distal internal carotid diameter as the denominator. CONTRAST:  55mL GADAVIST GADOBUTROL 1 MMOL/ML IV SOLN COMPARISON:  None FINDINGS: MRI HEAD Brain: There are numerous parenchymal T2 hyperintense lesions with enhancement involving the gray-white junction, central gray nuclei, cerebellum, and brainstem. Largest measures 9 mm along the right caudate. Mild associated edema. Susceptibility is present reflecting intralesional blood products or mineralization. There is suspected abnormal cortical leptomeningeal enhancement for example within the right precentral and central sulci. Some of the cerebellar enhancement may also be leptomeningeal. No significant  mass effect. There is no hydrocephalus or extra-axial fluid collection. Vascular: Major vessel flow voids at the skull base are preserved. Skull and upper cervical spine: Normal marrow signal is preserved. Sinuses/Orbits: Paranasal sinuses are aerated. Orbits are unremarkable. Other: Sella is unremarkable.  Mastoid air cells are clear. MRA HEAD Intracranial internal carotid arteries are patent. Middle and anterior cerebral arteries are patent. Intracranial vertebral arteries, basilar artery, posterior cerebral arteries are patent. Left posterior communicating artery is present. There is no significant stenosis or aneurysm. MRA NECK Common, internal, and external carotid arteries are patent. Extracranial vertebral arteries are patent. There is no hemodynamically significant stenosis. Right lung nodules. Suspect left supraclavicular and mediastinal adenopathy. IMPRESSION: Numerous subcentimeter enhancing parenchymal lesions throughout the brain with mild associated edema. Superimposed abnormal leptomeningeal enhancement. Incompletely evaluated right lung nodules and probable left supraclavicular and mediastinal adenopathy. Advanced metastatic disease is likely. No acute infarction. No large vessel occlusion or significant stenosis. Electronically Signed   By: Macy Mis M.D.   On: 08/05/2021 12:16   MR BRAIN W WO CONTRAST  Result Date: 08/05/2021 CLINICAL DATA:  Neuro deficit, acute, stroke suspected; Transient ischemic attack (TIA); code stroke for left-sided weakness EXAM: MRI HEAD WITHOUT CONTRAST MRA HEAD WITHOUT CONTRAST MRA NECK WITHOUT AND WITH CONTRAST TECHNIQUE: Multiplanar, multi-echo pulse sequences of the brain and surrounding structures were acquired without intravenous contrast. Angiographic images of  the Circle of Willis were acquired using MRA technique without intravenous contrast. Angiographic images of the neck were acquired using MRA technique without and with intravenous contrast. Carotid  stenosis measurements (when applicable) are obtained utilizing NASCET criteria, using the distal internal carotid diameter as the denominator. CONTRAST:  6mL GADAVIST GADOBUTROL 1 MMOL/ML IV SOLN COMPARISON:  None FINDINGS: MRI HEAD Brain: There are numerous parenchymal T2 hyperintense lesions with enhancement involving the gray-white junction, central gray nuclei, cerebellum, and brainstem. Largest measures 9 mm along the right caudate. Mild associated edema. Susceptibility is present reflecting intralesional blood products or mineralization. There is suspected abnormal cortical leptomeningeal enhancement for example within the right precentral and central sulci. Some of the cerebellar enhancement may also be leptomeningeal. No significant mass effect. There is no hydrocephalus or extra-axial fluid collection. Vascular: Major vessel flow voids at the skull base are preserved. Skull and upper cervical spine: Normal marrow signal is preserved. Sinuses/Orbits: Paranasal sinuses are aerated. Orbits are unremarkable. Other: Sella is unremarkable.  Mastoid air cells are clear. MRA HEAD Intracranial internal carotid arteries are patent. Middle and anterior cerebral arteries are patent. Intracranial vertebral arteries, basilar artery, posterior cerebral arteries are patent. Left posterior communicating artery is present. There is no significant stenosis or aneurysm. MRA NECK Common, internal, and external carotid arteries are patent. Extracranial vertebral arteries are patent. There is no hemodynamically significant stenosis. Right lung nodules. Suspect left supraclavicular and mediastinal adenopathy. IMPRESSION: Numerous subcentimeter enhancing parenchymal lesions throughout the brain with mild associated edema. Superimposed abnormal leptomeningeal enhancement. Incompletely evaluated right lung nodules and probable left supraclavicular and mediastinal adenopathy. Advanced metastatic disease is likely. No acute infarction.  No large vessel occlusion or significant stenosis. Electronically Signed   By: Macy Mis M.D.   On: 08/05/2021 12:16   CT HEAD CODE STROKE WO CONTRAST  Result Date: 08/05/2021 CLINICAL DATA:  Code stroke. Neuro deficit, acute, stroke suspected. Left-sided paralysis. EXAM: CT HEAD WITHOUT CONTRAST TECHNIQUE: Contiguous axial images were obtained from the base of the skull through the vertex without intravenous contrast. COMPARISON:  None. FINDINGS: Brain: The brain has normal appearance without evidence of atrophy, old or acute infarction, mass lesion, hemorrhage, hydrocephalus or extra-axial collection. Vascular: No abnormal vascular finding. Skull: Normal Sinuses/Orbits: Clear/normal Other: None ASPECTS ([place] Stroke Program Early CT Score) - Ganglionic level infarction (caudate, lentiform nuclei, internal capsule, insula, M1-M3 cortex): 7 - Supraganglionic infarction (M4-M6 cortex): 3 Total score (0-10 with 10 being normal): 10 IMPRESSION: 1. Normal head CT. 2. ASPECTS is 10. 3. These results were communicated to Dr. Cheral Marker at 8:41 am on 08/05/2021 by text page via the [facility] messaging system. Electronically Signed   By: Nelson Chimes M.D.   On: 08/05/2021 08:42    EKG: Independently reviewed. Sinus, no PR or QTC changes.  Assessment/Plan Active Problems:   Stroke (cerebrum) (HCC)   Brain metastases ([place])  (please populate well all problems here in Problem List. (For example, if patient is on BP meds at home and you resume or decide to hold them, it is a problem that needs to be her. Same for CAD, COPD, HLD and so on)  Stroke -Secondary to 1 more more of the metastatic lesion in the brain.  Discussed with [facility] oncology Dr.Uronis, agreed with Decadron, Keppra and request consultation With neurooncology at [facility].  Plan is to transfer patient to [facility] oncology, however no beds available at [facility] right now, patient will be admitted to [facility] stepdown unit for close monitoring and  transfer to [facility] when there is a bed. -Discussed with on-call neurooncology Dr. Mickeal Skinner, who will review patient's case at tumor board tomorrow.  Also discussed with on-call oncology Dr. Alen Blew, recommend 1 unit of platelet transfusion.  Long discussed with patient's family at bedside, all questions answered with my best knowledge. -Given risk of metastatic lesion bleeding, hold off aspirin.  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  Pancytopenia -Combined effect of chemotherapy as well as bone marrow metastatic lesions. -1 unit of platelet pheresis  Leukopenia -No fever, contact isolation for now.  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  DVT prophylaxis: SCD Code Status: Full code Family Communication: Brother-in-law at bedside Disposition Plan: Expect more than 2 midnight hospital stay Consults called: Oncology, neurology, neurooncology. Admission status: PCU   Lequita Halt MD [facility] Pager [phone]  08/05/2021, 6:19 PM

## 2021-08-05 NOTE — ED Notes (Signed)
Pt's family at the bedside asked this RN for update. This RN informed family that admission orders are in place. Per family, she needs radiation oncology, and her MD is at [facility]. They want to talk to a provider prior to the admission process. Secure chat Dr. Roosevelt Locks.

## 2021-08-05 NOTE — ED Notes (Signed)
Spoke with Abby at the [facility] per Dr. Johnnye Sima request

## 2021-08-05 NOTE — ED Notes (Signed)
Report called to [facility] at [facility].

## 2021-08-05 NOTE — ED Notes (Signed)
Dr. Roosevelt Locks at the bedside now talking to pt and family.

## 2021-08-05 NOTE — ED Triage Notes (Signed)
Pt BIB EMS, code stroke activated with c/o left sided weakness, confusion, slurred speech, left facial droop started around 06:15AM. Last well known 0600.

## 2021-08-05 NOTE — ED Notes (Signed)
Pt transported to MRI 

## 2021-08-05 NOTE — ED Provider Notes (Signed)
[facility] EMERGENCY DEPARTMENT Provider Note   CSN: [id number] Arrival date & time: 08/05/21  0258  An emergency department physician performed an initial assessment on this suspected stroke patient at 0828.  History Chief Complaint  Patient presents with   Code Stroke    Bianco Cange is a 35 y.o. female here with onset of left sided weakness, confussion, slurred speech, pt reporting this started around 0700 (she woke up feeling normal this morning).  Had a headache that has improved.  Arrives as a CODE STROKE.  She denies hx of migraines.  Not on A/C.  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  This is verified per medical chart review.  The patient had a right modified radical mastectomy in May 2021.  She reports she is on her second round of chemotherapy currently.  Per medical chart, she does also have a history of pancytopenia and thrombocytopenia.  Her medical records from [place] show 9 days ago, on October 10, the patient had a CBC showing white blood cell count of 1.5, platelets of 42, hemoglobin of 9.0.     No past medical history on file.  There are no problems to display for this patient.   OB History   No obstetric history on file.     No family history on file.     Home Medications Prior to Admission medications   Medication Sig Start Date End Date Taking? Authorizing Provider  cyclobenzaprine (FLEXERIL) 5 MG tablet Take 5 mg by mouth 2 (two) times daily.   Yes [provider]  famotidine (PEPCID) 20 MG tablet Take 20 mg by mouth 2 (two) times daily before a meal.   Yes [provider]  HYDROmorphone (DILAUDID) 2 MG tablet Take 2-4 mg by mouth every 4 (four) hours  as needed (for pain).   Yes [provider]  OLANZapine (ZYPREXA) 2.5 MG tablet Take 2.5 mg by mouth at bedtime.   Yes [provider]  potassium chloride SA (KLOR-CON) 20 MEQ tablet Take 20 mEq by mouth daily.   Yes [provider]  predniSONE (DELTASONE) 10 MG tablet Take 4 tablets (40 mg total) by mouth daily with breakfast for 10 days. 08/05/21 08/15/21 Yes Tricha Ruggirello, Carola Rhine, MD  senna-docusate (STOOL SOFTENER PLUS LAXATIVE) 8.6-50 MG tablet Take 1 tablet by mouth daily.   Yes [provider]    Allergies    Patient has no known allergies.  Review of Systems   Review of Systems  Constitutional:  Negative for chills and fever.  HENT:  Negative for ear pain and sore throat.   Eyes:  Negative for pain and visual disturbance.  Respiratory:  Negative for cough and shortness of breath.   Cardiovascular:  Negative for chest pain and palpitations.  Gastrointestinal:  Negative for abdominal pain and vomiting.  Genitourinary:  Negative for dysuria and hematuria.  Musculoskeletal:  Negative for arthralgias and back pain.  Skin:  Negative for color change and rash.  Neurological:  Positive for speech difficulty, weakness, light-headedness, numbness and headaches. Negative for syncope.  All other systems reviewed and are negative.  Physical Exam Updated Vital Signs BP (!) 123/97   Pulse (!) 113   Temp 97.6 F (36.4 C) (Oral)  Resp 18   Ht 5\' 3"  (1.6 m)   Wt 53.2 kg   SpO2 99%   BMI 20.78 kg/m   Physical Exam Constitutional:      General: She is not in acute distress. HENT:     Head: Normocephalic and atraumatic.  Eyes:     Conjunctiva/sclera: Conjunctivae normal.     Pupils: Pupils are equal, round, and reactive to light.  Cardiovascular:     Rate and Rhythm: Normal rate and regular rhythm.     Pulses: Normal pulses.     Comments: Left chest wall port Pulmonary:     Effort: Pulmonary effort is normal. No respiratory distress.  Abdominal:      General: There is no distension.     Tenderness: There is no abdominal tenderness.  Skin:    General: Skin is warm and dry.  Neurological:     General: No focal deficit present.     Mental Status: She is alert. Mental status is at baseline. She is disoriented.     Sensory: No sensory deficit.     Motor: No weakness.  Psychiatric:        Mood and Affect: Mood normal.        Behavior: Behavior normal.    ED Results / Procedures / Treatments   Labs (all labs ordered are listed, but only abnormal results are displayed) Labs Reviewed  CBC - Abnormal; Notable for the following components:      Result Value   WBC 1.5 (*)    RBC 2.62 (*)    Hemoglobin 8.0 (*)    HCT 25.1 (*)    RDW 17.2 (*)    Platelets 14 (*)    All other components within normal limits  DIFFERENTIAL - Abnormal; Notable for the following components:   Neutro Abs 0.7 (*)    Lymphs Abs 0.3 (*)    nRBC 7 (*)    Abs Immature Granulocytes 0.16 (*)    All other components within normal limits  COMPREHENSIVE METABOLIC PANEL - Abnormal; Notable for the following components:   Sodium 129 (*)    Potassium 3.4 (*)    Chloride 95 (*)    Glucose, Bld 115 (*)    BUN 5 (*)    Calcium 8.2 (*)    Total Protein 6.2 (*)    Albumin 3.2 (*)    AST 129 (*)    ALT 130 (*)    Alkaline Phosphatase 225 (*)    All other components within normal limits  I-STAT CHEM 8, ED - Abnormal; Notable for the following components:   Sodium 130 (*)    Chloride 93 (*)    BUN 5 (*)    Creatinine, Ser 0.30 (*)    Glucose, Bld 112 (*)    Calcium, Ion 1.03 (*)    Hemoglobin 8.5 (*)    HCT 25.0 (*)    All other components within normal limits  CBG MONITORING, ED - Abnormal; Notable for the following components:   Glucose-Capillary 109 (*)    All other components within normal limits  PROTIME-INR  APTT  PATHOLOGIST SMEAR REVIEW  I-STAT BETA HCG BLOOD, ED ([facility], [facility], [facility] ONLY)    EKG None  Radiology MR ANGIO HEAD WO CONTRAST  Result  Date: 08/05/2021 CLINICAL DATA:  Neuro deficit, acute, stroke suspected; Transient ischemic attack (TIA); code stroke for left-sided weakness EXAM: MRI HEAD WITHOUT CONTRAST MRA HEAD WITHOUT CONTRAST MRA NECK WITHOUT AND WITH CONTRAST TECHNIQUE: Multiplanar, multi-echo pulse sequences  of the brain and surrounding structures were acquired without intravenous contrast. Angiographic images of the Circle of Willis were acquired using MRA technique without intravenous contrast. Angiographic images of the neck were acquired using MRA technique without and with intravenous contrast. Carotid stenosis measurements (when applicable) are obtained utilizing NASCET criteria, using the distal internal carotid diameter as the denominator. CONTRAST:  48mL GADAVIST GADOBUTROL 1 MMOL/ML IV SOLN COMPARISON:  None FINDINGS: MRI HEAD Brain: There are numerous parenchymal T2 hyperintense lesions with enhancement involving the gray-white junction, central gray nuclei, cerebellum, and brainstem. Largest measures 9 mm along the right caudate. Mild associated edema. Susceptibility is present reflecting intralesional blood products or mineralization. There is suspected abnormal cortical leptomeningeal enhancement for example within the right precentral and central sulci. Some of the cerebellar enhancement may also be leptomeningeal. No significant mass effect. There is no hydrocephalus or extra-axial fluid collection. Vascular: Major vessel flow voids at the skull base are preserved. Skull and upper cervical spine: Normal marrow signal is preserved. Sinuses/Orbits: Paranasal sinuses are aerated. Orbits are unremarkable. Other: Sella is unremarkable.  Mastoid air cells are clear. MRA HEAD Intracranial internal carotid arteries are patent. Middle and anterior cerebral arteries are patent. Intracranial vertebral arteries, basilar artery, posterior cerebral arteries are patent. Left posterior communicating artery is present. There is no  significant stenosis or aneurysm. MRA NECK Common, internal, and external carotid arteries are patent. Extracranial vertebral arteries are patent. There is no hemodynamically significant stenosis. Right lung nodules. Suspect left supraclavicular and mediastinal adenopathy. IMPRESSION: Numerous subcentimeter enhancing parenchymal lesions throughout the brain with mild associated edema. Superimposed abnormal leptomeningeal enhancement. Incompletely evaluated right lung nodules and probable left supraclavicular and mediastinal adenopathy. Advanced metastatic disease is likely. No acute infarction. No large vessel occlusion or significant stenosis. Electronically Signed   By: Macy Mis M.D.   On: 08/05/2021 12:16   MR ANGIO NECK W WO CONTRAST  Result Date: 08/05/2021 CLINICAL DATA:  Neuro deficit, acute, stroke suspected; Transient ischemic attack (TIA); code stroke for left-sided weakness EXAM: MRI HEAD WITHOUT CONTRAST MRA HEAD WITHOUT CONTRAST MRA NECK WITHOUT AND WITH CONTRAST TECHNIQUE: Multiplanar, multi-echo pulse sequences of the brain and surrounding structures were acquired without intravenous contrast. Angiographic images of the Circle of Willis were acquired using MRA technique without intravenous contrast. Angiographic images of the neck were acquired using MRA technique without and with intravenous contrast. Carotid stenosis measurements (when applicable) are obtained utilizing NASCET criteria, using the distal internal carotid diameter as the denominator. CONTRAST:  64mL GADAVIST GADOBUTROL 1 MMOL/ML IV SOLN COMPARISON:  None FINDINGS: MRI HEAD Brain: There are numerous parenchymal T2 hyperintense lesions with enhancement involving the gray-white junction, central gray nuclei, cerebellum, and brainstem. Largest measures 9 mm along the right caudate. Mild associated edema. Susceptibility is present reflecting intralesional blood products or mineralization. There is suspected abnormal cortical  leptomeningeal enhancement for example within the right precentral and central sulci. Some of the cerebellar enhancement may also be leptomeningeal. No significant mass effect. There is no hydrocephalus or extra-axial fluid collection. Vascular: Major vessel flow voids at the skull base are preserved. Skull and upper cervical spine: Normal marrow signal is preserved. Sinuses/Orbits: Paranasal sinuses are aerated. Orbits are unremarkable. Other: Sella is unremarkable.  Mastoid air cells are clear. MRA HEAD Intracranial internal carotid arteries are patent. Middle and anterior cerebral arteries are patent. Intracranial vertebral arteries, basilar artery, posterior cerebral arteries are patent. Left posterior communicating artery is present. There is no significant stenosis or aneurysm. MRA NECK Common,  internal, and external carotid arteries are patent. Extracranial vertebral arteries are patent. There is no hemodynamically significant stenosis. Right lung nodules. Suspect left supraclavicular and mediastinal adenopathy. IMPRESSION: Numerous subcentimeter enhancing parenchymal lesions throughout the brain with mild associated edema. Superimposed abnormal leptomeningeal enhancement. Incompletely evaluated right lung nodules and probable left supraclavicular and mediastinal adenopathy. Advanced metastatic disease is likely. No acute infarction. No large vessel occlusion or significant stenosis. Electronically Signed   By: Macy Mis M.D.   On: 08/05/2021 12:16   MR BRAIN W WO CONTRAST  Result Date: 08/05/2021 CLINICAL DATA:  Neuro deficit, acute, stroke suspected; Transient ischemic attack (TIA); code stroke for left-sided weakness EXAM: MRI HEAD WITHOUT CONTRAST MRA HEAD WITHOUT CONTRAST MRA NECK WITHOUT AND WITH CONTRAST TECHNIQUE: Multiplanar, multi-echo pulse sequences of the brain and surrounding structures were acquired without intravenous contrast. Angiographic images of the Circle of Willis were  acquired using MRA technique without intravenous contrast. Angiographic images of the neck were acquired using MRA technique without and with intravenous contrast. Carotid stenosis measurements (when applicable) are obtained utilizing NASCET criteria, using the distal internal carotid diameter as the denominator. CONTRAST:  79mL GADAVIST GADOBUTROL 1 MMOL/ML IV SOLN COMPARISON:  None FINDINGS: MRI HEAD Brain: There are numerous parenchymal T2 hyperintense lesions with enhancement involving the gray-white junction, central gray nuclei, cerebellum, and brainstem. Largest measures 9 mm along the right caudate. Mild associated edema. Susceptibility is present reflecting intralesional blood products or mineralization. There is suspected abnormal cortical leptomeningeal enhancement for example within the right precentral and central sulci. Some of the cerebellar enhancement may also be leptomeningeal. No significant mass effect. There is no hydrocephalus or extra-axial fluid collection. Vascular: Major vessel flow voids at the skull base are preserved. Skull and upper cervical spine: Normal marrow signal is preserved. Sinuses/Orbits: Paranasal sinuses are aerated. Orbits are unremarkable. Other: Sella is unremarkable.  Mastoid air cells are clear. MRA HEAD Intracranial internal carotid arteries are patent. Middle and anterior cerebral arteries are patent. Intracranial vertebral arteries, basilar artery, posterior cerebral arteries are patent. Left posterior communicating artery is present. There is no significant stenosis or aneurysm. MRA NECK Common, internal, and external carotid arteries are patent. Extracranial vertebral arteries are patent. There is no hemodynamically significant stenosis. Right lung nodules. Suspect left supraclavicular and mediastinal adenopathy. IMPRESSION: Numerous subcentimeter enhancing parenchymal lesions throughout the brain with mild associated edema. Superimposed abnormal leptomeningeal  enhancement. Incompletely evaluated right lung nodules and probable left supraclavicular and mediastinal adenopathy. Advanced metastatic disease is likely. No acute infarction. No large vessel occlusion or significant stenosis. Electronically Signed   By: Macy Mis M.D.   On: 08/05/2021 12:16   CT HEAD CODE STROKE WO CONTRAST  Result Date: 08/05/2021 CLINICAL DATA:  Code stroke. Neuro deficit, acute, stroke suspected. Left-sided paralysis. EXAM: CT HEAD WITHOUT CONTRAST TECHNIQUE: Contiguous axial images were obtained from the base of the skull through the vertex without intravenous contrast. COMPARISON:  None. FINDINGS: Brain: The brain has normal appearance without evidence of atrophy, old or acute infarction, mass lesion, hemorrhage, hydrocephalus or extra-axial collection. Vascular: No abnormal vascular finding. Skull: Normal Sinuses/Orbits: Clear/normal Other: None ASPECTS ([place] Stroke Program Early CT Score) - Ganglionic level infarction (caudate, lentiform nuclei, internal capsule, insula, M1-M3 cortex): 7 - Supraganglionic infarction (M4-M6 cortex): 3 Total score (0-10 with 10 being normal): 10 IMPRESSION: 1. Normal head CT. 2. ASPECTS is 10. 3. These results were communicated to Dr. Cheral Marker at 8:41 am on 08/05/2021 by text page via the [facility] messaging system. Electronically  Signed   By: Nelson Chimes M.D.   On: 08/05/2021 08:42    Procedures Procedures   Medications Ordered in ED Medications  sodium chloride flush (NS) 0.9 % injection 3 mL (3 mLs Intravenous Not Given 08/05/21 0901)  HYDROmorphone (DILAUDID) injection 1 mg (1 mg Intravenous Not Given 08/05/21 1552)  HYDROmorphone (DILAUDID) injection 1 mg (has no administration in time range)  gadobutrol (GADAVIST) 1 MMOL/ML injection 5 mL (5 mLs Intravenous Contrast Given 08/05/21 1159)  HYDROmorphone (DILAUDID) injection 1 mg (1 mg Intravenous Given 08/05/21 1549)  dexamethasone (DECADRON) injection 10 mg (10 mg Intravenous Given  08/05/21 1438)  sodium chloride 0.9 % bolus 1,000 mL (0 mLs Intravenous Stopped 08/05/21 1545)  diphenhydrAMINE (BENADRYL) injection 12.5 mg (12.5 mg Intravenous Given 08/05/21 1445)    ED Course  I have reviewed the triage vital signs and the nursing notes.  Pertinent labs & imaging results that were available during my care of the patient were reviewed by me and considered in my medical decision making (see chart for details).  Pt arrives with report of left sided weakness, confusion onset this morning DDX includes hypoglycemia vs CVA vs complex migraine vs other  She has a benign neuro exam for me, no pronator drift or obvious facial droop/deficits.  She does appear mildly confused, but is fully oriented.  She responds slowly to questions.  Afebrile, no nuccal rigidity, no complaint of HA on my exam - doubt meningitis or CNS infection  CTH with ASPECTS score 10 Neuro consulted, recommending MRI, which is pending   MRI reviewed concerning for brain metastasis On repeat reassessment, the patient's neurological symptoms have improved back to baseline - no neuro deficits  I personally reviewed and interpreted patient's labs.  She has pancytopenia which appears to be a chronic issue per my review of her medical records including her [facility] records.  She has no active signs or symptoms of bleeding at this time.  I discussed these findings with her oncologist by phone from [facility], as noted below.  Her EKG per my interpretation shows a sinus tachycardia which appears to her baseline level, no acute ischemic findings.   Clinical Course as of 08/05/21 1626  Wed Aug 05, 2021  1025 Pancytop[place] [MT]  1249 My reassessment the patient's brother and father are now present at the bedside.  They report that the patient has chronic pancytopenia, which I confirmed per care everywhere records.  He also report that she has chronic baseline tachycardia, that this elevated heart rate is not new for her.  The  patient denies that she has an active headache.  She is asking for Dilaudid as a chronic pain medicine for her back pain.  We will give her the Dilaudid.  She is also asking for food and we can provide some food.  I will touch base with neurology regarding further recommendations now that her MRI imaging has returned. [MT]  56 Dr Lazarus Salines  [MT]  1512 I spoek to Dr Fanny Skates at [facility] (oncology) who had conferred with Dr Wetzel Bjornstad (patient's primary oncologist), and they are attempting to make arrangements for very close outpatient follow-up, potentially tomorrow, regarding these new brain metastases.  They agreed that if the patient has minimal symptoms at this time, and feels like her self, prefer to go home, that would be reasonable to do on steroids.  I discussed the diagnosis of the brain lesions with the patient and her father and brother at bedside.  I explained the plan for  very close outpatient follow-up versus inpatient transfer.  They would prefer to try to manage this at home and follow-up in the next day or 2 if possible.  I think this is reasonable.  However she did express concerns that she has had adverse reactions to steroids in the past, which is primarily tachycardia, and therefore we will need to observe her for a little while in the ED after giving the dexamethasone.  If she does develop worsening tachycardia or symptoms after steroids, she will likely need to remain for inpatient transfer.  I have advised nursing to administer the medication with some IV fluids as well as a very small dose of Benadryl. [MT]  59 Pt signed out to Dr Armandina Gemma EDP pending callback from [facility] transfer center and reassessment of symptoms after steroids. [MT]    Clinical Course User Index [MT] Nialah Saravia, Carola Rhine, MD    Final Clinical Impression(s) / ED Diagnoses Final diagnoses:  Brain metastasis [facility])    Rx / DC Orders ED Discharge Orders          Ordered    predniSONE (DELTASONE) 10 MG tablet  Daily  with breakfast        08/05/21 1623             Wyvonnia Dusky, MD 08/05/21 1627

## 2021-08-05 NOTE — Code Documentation (Signed)
Pt is Shelley Pena [facility]?), a 35 yr old female. She has a current history of breast cancer for which she is getting chemotherapy. She was last known well this morning at 630. After that time, she became unable to move her left side. EMS was called. EMS activated code stroke on their arrival, as pt was still weak on left, and had L facial droop. Pt arrived [facility] at [phone]. She no longer has drift on left arm or leg. She does have lt facial droop. (NIHSS 2, see timeline for details). Pt cleared by EDP, blood drawn, CBG obtained at bridge. Pt to CT at 0832. CT head neg for acute hemorrhage per Dr Cheral Marker. Pt seems slightly weaker on left, but symptoms are mild. Pt returned to room 29, where her work-up will continue. She will need q 15 min VS and q 30 min mNIHSS until OOW for TNK (1100). At that time she will need q 2 VS and mNIHSS. Plan of care discussed with [facility]. No Thrombolysis as too mild to treat. No NIR as exam LVO negative.

## 2021-08-05 NOTE — ED Notes (Signed)
[facility] called for transport. 

## 2021-08-06 ENCOUNTER — Inpatient Hospital Stay (HOSPITAL_COMMUNITY): Payer: BC Managed Care – PPO

## 2021-08-06 ENCOUNTER — Encounter (HOSPITAL_COMMUNITY): Payer: Self-pay | Admitting: Internal Medicine

## 2021-08-06 ENCOUNTER — Other Ambulatory Visit: Payer: Self-pay | Admitting: Radiation Therapy

## 2021-08-06 DIAGNOSIS — C50919 Malignant neoplasm of unspecified site of unspecified female breast: Secondary | ICD-10-CM | POA: Diagnosis present

## 2021-08-06 DIAGNOSIS — C7931 Secondary malignant neoplasm of brain: Principal | ICD-10-CM

## 2021-08-06 DIAGNOSIS — G8194 Hemiplegia, unspecified affecting left nondominant side: Secondary | ICD-10-CM

## 2021-08-06 DIAGNOSIS — D61818 Other pancytopenia: Secondary | ICD-10-CM | POA: Diagnosis present

## 2021-08-06 LAB — COMPREHENSIVE METABOLIC PANEL
ALT: 157 U/L — ABNORMAL HIGH (ref 0–44)
AST: 154 U/L — ABNORMAL HIGH (ref 15–41)
Albumin: 3.2 g/dL — ABNORMAL LOW (ref 3.5–5.0)
Alkaline Phosphatase: 202 U/L — ABNORMAL HIGH (ref 38–126)
Anion gap: 9 (ref 5–15)
BUN: 6 mg/dL (ref 6–20)
CO2: 20 mmol/L — ABNORMAL LOW (ref 22–32)
Calcium: 7.2 mg/dL — ABNORMAL LOW (ref 8.9–10.3)
Chloride: 99 mmol/L (ref 98–111)
Creatinine, Ser: <0.3 mg/dL — ABNORMAL LOW (ref 0.44–1.00)
Glucose, Bld: 103 mg/dL — ABNORMAL HIGH (ref 70–99)
Potassium: 3.7 mmol/L (ref 3.5–5.1)
Sodium: 128 mmol/L — ABNORMAL LOW (ref 135–145)
Total Bilirubin: 1 mg/dL (ref 0.3–1.2)
Total Protein: 6 g/dL — ABNORMAL LOW (ref 6.5–8.1)

## 2021-08-06 LAB — ECHOCARDIOGRAM COMPLETE
Calc EF: 57.8 %
Height: 63 in
S' Lateral: 2.2 cm
Single Plane A2C EF: 60.3 %
Single Plane A4C EF: 60 %
Weight: 1876.56 oz

## 2021-08-06 LAB — CBC WITH DIFFERENTIAL/PLATELET
Abs Immature Granulocytes: 0.21 10*3/uL — ABNORMAL HIGH (ref 0.00–0.07)
Basophils Absolute: 0 10*3/uL (ref 0.0–0.1)
Basophils Relative: 0 %
Eosinophils Absolute: 0 10*3/uL (ref 0.0–0.5)
Eosinophils Relative: 1 %
HCT: 21.8 % — ABNORMAL LOW (ref 36.0–46.0)
Hemoglobin: 7 g/dL — ABNORMAL LOW (ref 12.0–15.0)
Immature Granulocytes: 10 %
Lymphocytes Relative: 17 %
Lymphs Abs: 0.3 10*3/uL — ABNORMAL LOW (ref 0.7–4.0)
MCH: 30.6 pg (ref 26.0–34.0)
MCHC: 32.1 g/dL (ref 30.0–36.0)
MCV: 95.2 fL (ref 80.0–100.0)
Monocytes Absolute: 0.4 10*3/uL (ref 0.1–1.0)
Monocytes Relative: 20 %
Neutro Abs: 1.1 10*3/uL — ABNORMAL LOW (ref 1.7–7.7)
Neutrophils Relative %: 52 %
Platelets: 35 10*3/uL — ABNORMAL LOW (ref 150–400)
RBC: 2.29 MIL/uL — ABNORMAL LOW (ref 3.87–5.11)
RDW: 17.7 % — ABNORMAL HIGH (ref 11.5–15.5)
WBC: 2 10*3/uL — ABNORMAL LOW (ref 4.0–10.5)
nRBC: 4.9 % — ABNORMAL HIGH (ref 0.0–0.2)

## 2021-08-06 LAB — CBC
HCT: 21.7 % — ABNORMAL LOW (ref 36.0–46.0)
Hemoglobin: 7.1 g/dL — ABNORMAL LOW (ref 12.0–15.0)
MCH: 31.1 pg (ref 26.0–34.0)
MCHC: 32.7 g/dL (ref 30.0–36.0)
MCV: 95.2 fL (ref 80.0–100.0)
Platelets: 14 10*3/uL — CL (ref 150–400)
RBC: 2.28 MIL/uL — ABNORMAL LOW (ref 3.87–5.11)
RDW: 17.7 % — ABNORMAL HIGH (ref 11.5–15.5)
WBC: 1.9 10*3/uL — ABNORMAL LOW (ref 4.0–10.5)
nRBC: 5.8 % — ABNORMAL HIGH (ref 0.0–0.2)

## 2021-08-06 LAB — LIPID PANEL
Cholesterol: 132 mg/dL (ref 0–200)
HDL: 42 mg/dL (ref >40)
LDL Cholesterol: 70 mg/dL (ref 0–99)
Total CHOL/HDL Ratio: 3.1 RATIO
Triglycerides: 99 mg/dL (ref <150)
VLDL: 20 mg/dL (ref 0–40)

## 2021-08-06 LAB — TYPE AND SCREEN
ABO/RH(D): B POS
Antibody Screen: NEGATIVE

## 2021-08-06 LAB — HIV ANTIBODY (ROUTINE TESTING W REFLEX): HIV Screen 4th Generation wRfx: NONREACTIVE

## 2021-08-06 LAB — HEMOGLOBIN A1C
Hgb A1c MFr Bld: 6.4 % — ABNORMAL HIGH (ref 4.8–5.6)
Mean Plasma Glucose: 136.98 mg/dL

## 2021-08-06 LAB — ABO/RH: ABO/RH(D): B POS

## 2021-08-06 MED ORDER — SODIUM CHLORIDE 0.9% FLUSH
10.0000 mL | Freq: Two times a day (BID) | INTRAVENOUS | Status: DC
  Administered 2021-08-06: 10 mL

## 2021-08-06 MED ORDER — SODIUM CHLORIDE 0.9% FLUSH: 10.0000 mL | INTRAVENOUS | Status: DC | PRN

## 2021-08-06 NOTE — Progress Notes (Signed)
PT Cancellation Note  Patient Details Name: Shelley Pena MRN: [id number] DOB: Mar 15, 1986   Cancelled Treatment:    Reason Eval/Treat Not Completed: PT screened, no needs identified, will sign off RN reports that patient is up ad lib without support.   Claretha Cooper 08/06/2021, 9:59 AM  [place] Pager [phone] Office [phone]

## 2021-08-06 NOTE — Progress Notes (Signed)
Radiation Oncology         [phone] ________________________________  Name: Shelley Pena MRN: [id number]  Date: 08/05/2021  DOB: [birth year]  Chart Note:  We were made aware of this patient's admission and have reviewed her most recent findings and wanted to take a minute to document our impression.  She has metastatic breast cancer that is being managed by [facility], s/p right mastectomy and has been on chemotherapy. She was recently admitted with new onset left sided weakness, confusion, left sided facial droop and slurred speech. MRI on admission showed numerous, subcentimeter enhancing parenchymal lesions throughout the brain with associated edema. She wishes to continue her care at [facility] but unfortunately, there were no beds available so she has been admitted to [facility] for medical management until a bed becomes available for transfer to [facility]. She has been started on Decadron 4mg  q6h and Keppra for seizure prophylaxis which is appropriate.   Lab Findings: Lab Results  Component Value Date   WBC 2.0 (L) 08/06/2021   HGB 7.0 (L) 08/06/2021   HCT 21.8 (L) 08/06/2021   MCV 95.2 08/06/2021   PLT 35 (L) 08/06/2021    @LASTCHEM @  Radiographic Findings: MR ANGIO HEAD WO CONTRAST  Result Date: 08/05/2021 CLINICAL DATA:  Neuro deficit, acute, stroke suspected; Transient ischemic attack (TIA); code stroke for left-sided weakness EXAM: MRI HEAD WITHOUT CONTRAST MRA HEAD WITHOUT CONTRAST MRA NECK WITHOUT AND WITH CONTRAST TECHNIQUE: Multiplanar, multi-echo pulse sequences of the brain and surrounding structures were acquired without intravenous contrast. Angiographic images of the Circle of Willis were acquired using MRA technique without intravenous contrast. Angiographic images of the neck were acquired using MRA technique without and with intravenous contrast. Carotid stenosis measurements (when applicable) are obtained utilizing NASCET criteria, using the distal internal carotid  diameter as the denominator. CONTRAST:  43mL GADAVIST GADOBUTROL 1 MMOL/ML IV SOLN COMPARISON:  None FINDINGS: MRI HEAD Brain: There are numerous parenchymal T2 hyperintense lesions with enhancement involving the gray-white junction, central gray nuclei, cerebellum, and brainstem. Largest measures 9 mm along the right caudate. Mild associated edema. Susceptibility is present reflecting intralesional blood products or mineralization. There is suspected abnormal cortical leptomeningeal enhancement for example within the right precentral and central sulci. Some of the cerebellar enhancement may also be leptomeningeal. No significant mass effect. There is no hydrocephalus or extra-axial fluid collection. Vascular: Major vessel flow voids at the skull base are preserved. Skull and upper cervical spine: Normal marrow signal is preserved. Sinuses/Orbits: Paranasal sinuses are aerated. Orbits are unremarkable. Other: Sella is unremarkable.  Mastoid air cells are clear. MRA HEAD Intracranial internal carotid arteries are patent. Middle and anterior cerebral arteries are patent. Intracranial vertebral arteries, basilar artery, posterior cerebral arteries are patent. Left posterior communicating artery is present. There is no significant stenosis or aneurysm. MRA NECK Common, internal, and external carotid arteries are patent. Extracranial vertebral arteries are patent. There is no hemodynamically significant stenosis. Right lung nodules. Suspect left supraclavicular and mediastinal adenopathy. IMPRESSION: Numerous subcentimeter enhancing parenchymal lesions throughout the brain with mild associated edema. Superimposed abnormal leptomeningeal enhancement. Incompletely evaluated right lung nodules and probable left supraclavicular and mediastinal adenopathy. Advanced metastatic disease is likely. No acute infarction. No large vessel occlusion or significant stenosis. Electronically Signed   By: Macy Mis M.D.   On:  08/05/2021 12:16   MR ANGIO NECK W WO CONTRAST  Result Date: 08/05/2021 CLINICAL DATA:  Neuro deficit, acute, stroke suspected; Transient ischemic attack (TIA); code stroke for left-sided weakness EXAM:  MRI HEAD WITHOUT CONTRAST MRA HEAD WITHOUT CONTRAST MRA NECK WITHOUT AND WITH CONTRAST TECHNIQUE: Multiplanar, multi-echo pulse sequences of the brain and surrounding structures were acquired without intravenous contrast. Angiographic images of the Circle of Willis were acquired using MRA technique without intravenous contrast. Angiographic images of the neck were acquired using MRA technique without and with intravenous contrast. Carotid stenosis measurements (when applicable) are obtained utilizing NASCET criteria, using the distal internal carotid diameter as the denominator. CONTRAST:  57mL GADAVIST GADOBUTROL 1 MMOL/ML IV SOLN COMPARISON:  None FINDINGS: MRI HEAD Brain: There are numerous parenchymal T2 hyperintense lesions with enhancement involving the gray-white junction, central gray nuclei, cerebellum, and brainstem. Largest measures 9 mm along the right caudate. Mild associated edema. Susceptibility is present reflecting intralesional blood products or mineralization. There is suspected abnormal cortical leptomeningeal enhancement for example within the right precentral and central sulci. Some of the cerebellar enhancement may also be leptomeningeal. No significant mass effect. There is no hydrocephalus or extra-axial fluid collection. Vascular: Major vessel flow voids at the skull base are preserved. Skull and upper cervical spine: Normal marrow signal is preserved. Sinuses/Orbits: Paranasal sinuses are aerated. Orbits are unremarkable. Other: Sella is unremarkable.  Mastoid air cells are clear. MRA HEAD Intracranial internal carotid arteries are patent. Middle and anterior cerebral arteries are patent. Intracranial vertebral arteries, basilar artery, posterior cerebral arteries are patent. Left  posterior communicating artery is present. There is no significant stenosis or aneurysm. MRA NECK Common, internal, and external carotid arteries are patent. Extracranial vertebral arteries are patent. There is no hemodynamically significant stenosis. Right lung nodules. Suspect left supraclavicular and mediastinal adenopathy. IMPRESSION: Numerous subcentimeter enhancing parenchymal lesions throughout the brain with mild associated edema. Superimposed abnormal leptomeningeal enhancement. Incompletely evaluated right lung nodules and probable left supraclavicular and mediastinal adenopathy. Advanced metastatic disease is likely. No acute infarction. No large vessel occlusion or significant stenosis. Electronically Signed   By: Macy Mis M.D.   On: 08/05/2021 12:16   MR BRAIN W WO CONTRAST  Result Date: 08/05/2021 CLINICAL DATA:  Neuro deficit, acute, stroke suspected; Transient ischemic attack (TIA); code stroke for left-sided weakness EXAM: MRI HEAD WITHOUT CONTRAST MRA HEAD WITHOUT CONTRAST MRA NECK WITHOUT AND WITH CONTRAST TECHNIQUE: Multiplanar, multi-echo pulse sequences of the brain and surrounding structures were acquired without intravenous contrast. Angiographic images of the Circle of Willis were acquired using MRA technique without intravenous contrast. Angiographic images of the neck were acquired using MRA technique without and with intravenous contrast. Carotid stenosis measurements (when applicable) are obtained utilizing NASCET criteria, using the distal internal carotid diameter as the denominator. CONTRAST:  36mL GADAVIST GADOBUTROL 1 MMOL/ML IV SOLN COMPARISON:  None FINDINGS: MRI HEAD Brain: There are numerous parenchymal T2 hyperintense lesions with enhancement involving the gray-white junction, central gray nuclei, cerebellum, and brainstem. Largest measures 9 mm along the right caudate. Mild associated edema. Susceptibility is present reflecting intralesional blood products or  mineralization. There is suspected abnormal cortical leptomeningeal enhancement for example within the right precentral and central sulci. Some of the cerebellar enhancement may also be leptomeningeal. No significant mass effect. There is no hydrocephalus or extra-axial fluid collection. Vascular: Major vessel flow voids at the skull base are preserved. Skull and upper cervical spine: Normal marrow signal is preserved. Sinuses/Orbits: Paranasal sinuses are aerated. Orbits are unremarkable. Other: Sella is unremarkable.  Mastoid air cells are clear. MRA HEAD Intracranial internal carotid arteries are patent. Middle and anterior cerebral arteries are patent. Intracranial vertebral arteries, basilar artery, posterior cerebral arteries  are patent. Left posterior communicating artery is present. There is no significant stenosis or aneurysm. MRA NECK Common, internal, and external carotid arteries are patent. Extracranial vertebral arteries are patent. There is no hemodynamically significant stenosis. Right lung nodules. Suspect left supraclavicular and mediastinal adenopathy. IMPRESSION: Numerous subcentimeter enhancing parenchymal lesions throughout the brain with mild associated edema. Superimposed abnormal leptomeningeal enhancement. Incompletely evaluated right lung nodules and probable left supraclavicular and mediastinal adenopathy. Advanced metastatic disease is likely. No acute infarction. No large vessel occlusion or significant stenosis. Electronically Signed   By: Macy Mis M.D.   On: 08/05/2021 12:16   CT HEAD CODE STROKE WO CONTRAST  Result Date: 08/05/2021 CLINICAL DATA:  Code stroke. Neuro deficit, acute, stroke suspected. Left-sided paralysis. EXAM: CT HEAD WITHOUT CONTRAST TECHNIQUE: Contiguous axial images were obtained from the base of the skull through the vertex without intravenous contrast. COMPARISON:  None. FINDINGS: Brain: The brain has normal appearance without evidence of atrophy, old  or acute infarction, mass lesion, hemorrhage, hydrocephalus or extra-axial collection. Vascular: No abnormal vascular finding. Skull: Normal Sinuses/Orbits: Clear/normal Other: None ASPECTS ([place] Stroke Program Early CT Score) - Ganglionic level infarction (caudate, lentiform nuclei, internal capsule, insula, M1-M3 cortex): 7 - Supraganglionic infarction (M4-M6 cortex): 3 Total score (0-10 with 10 being normal): 10 IMPRESSION: 1. Normal head CT. 2. ASPECTS is 10. 3. These results were communicated to Dr. Cheral Marker at 8:41 am on 08/05/2021 by text page via the [facility] messaging system. Electronically Signed   By: Nelson Chimes M.D.   On: 08/05/2021 08:42    Impression:  In light of this information, the patient would potentially benefit from whole brain irradiation given the number of lesions present, not felt to be a candidate for stereotactic radiosurgery.  Whole brain radiotherapy would treat the known metastatic deposits and help provide some reduction of risk for future brain metastases. However, whole brain radiotherapy carries potential risks including hair loss, subacute somnolence, and neurocognitive changes including a possible reduction in short-term memory. Whole brain radiotherapy also may carry a lower likelihood of tumor control at the treatment sites because of the low-dose used.    Plan:  At this point, the patient is set up to proceed with transfer to [facility] for continued oncologic care as soon as a bed is available, per her preference, so we will remain on standby and are happy to be involved in her care should she change her mind and opt to have treatment here in [place].     Nicholos Johns, PA-C    Tyler Pita, MD  [facility] Direct Dial: [phone]  Fax: [phone] [email or web address]  Skype  LinkedIn

## 2021-08-06 NOTE — Progress Notes (Signed)
Patient took 2 mg of dilaudid, and 20 mg of famotidine around 0200 from home supply. Patient informed that while in hospital we prefer to give medications so we can keep track of the times they were administered. Patient and family member expressed understanding and will call when patient needs medications again.

## 2021-08-06 NOTE — Progress Notes (Addendum)
Progress Note    Shelley Pena  MWN:[id number] DOB: November 17, 1985  DOA: 08/05/2021 PCP: Pcp, No      Brief Narrative:    Medical records reviewed and are as summarized below:  Shelley Pena is a 35 y.o. 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      Assessment/Plan:   Principal Problem:   Brain metastases ([facility]) Active Problems:   Breast cancer ([facility])   Pancytopenia ([facility])    Body mass index is 20.78 kg/m.   Breast cancer with metastasis to the brain: No evidence of acute stroke on MRI brain.  Continue IV Decadron and Keppra.  She has been evaluated by radiation oncology team.  Patient prefers to be treated at [facility].  Notified Dr. Mickeal Skinner, neuro oncologist.  He said the case will be reviewed at tumor board next week if she wants to be treated here.  She follows at [facility] for chemotherapy.  Awaiting transfer to [facility].  Consult palliative care team.  Pancytopenia: s/p transfusion with 1 unit of platelets.  Monitor CBC.  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  Diet Order             Diet regular Room service appropriate? Yes; Fluid consistency: Thin  Diet effective now                      Consultants: Radiation oncologist  Procedures: None    Medications:    sodium chloride   Intravenous Once   Chlorhexidine Gluconate Cloth  6 each Topical Q2200   dexamethasone  4 mg Oral Q6H    HYDROmorphone (DILAUDID) injection  1 mg Intravenous Once   OLANZapine  2.5 mg Oral QHS   potassium chloride SA  20 mEq Oral Daily   sodium chloride flush  10-40 mL Intracatheter Q12H   sodium chloride flush  3 mL Intravenous Once   Continuous Infusions:  sodium chloride 75 mL/hr at 08/05/21 2044   levETIRAcetam Stopped (08/05/21 2133)     Anti-infectives (From  admission, onward)    None              Family Communication/Anticipated D/C date and plan/Code Status   DVT prophylaxis: SCD's Start: 08/05/21 1800     Code Status: Full Code  Family Communication: Plan discussed with father, sister and brother-in-law at the bedside Disposition Plan: Awaiting transfer to [facility]   Status is: Inpatient  Remains inpatient appropriate because: Breast cancer with metastasis to the brain awaiting transfer to [facility] hospital           Subjective:   Interval events noted.  She has no complaints.  Objective:    Vitals:   08/06/21 0800 08/06/21 0900 08/06/21 1000 08/06/21 1031  BP: (!) 140/101 (!) 137/100 (!) 124/93 (!) 122/91  Pulse: (!) 109 (!) 114  (!) 131  Resp: (!) 21 (!) 23 (!) 28 18  Temp: 97.8 F (36.6 C)   97.8 F (36.6 C)  TempSrc: Oral   Oral  SpO2: 100% 100%  100%  Weight:      Height:       No data found.   Intake/Output Summary (Last 24 hours) at 08/06/2021 1519 Last data filed at 08/06/2021 0600 Gross per 24 hour  Intake 1450.3 ml  Output --  Net 1450.3 ml   [employer]  08/05/21 0800  Weight: 53.2 kg    Exam:  GEN: NAD SKIN: Warm and dry EYES: EOMI ENT: MMM CV: RRR PULM: CTA B ABD: soft, ND, NT, +BS CNS: AAO x 3, non focal EXT: No edema or tenderness        Data Reviewed:   I have personally reviewed following labs and imaging studies:  Labs: Labs show the following:   Basic Metabolic Panel: Recent Labs  Lab 08/05/21 0832 08/05/21 0834 08/05/21 2206 08/06/21 0347  NA 129* 130*  --  128*  K 3.4* 3.6  --  3.7  CL 95* 93*  --  99  CO2 23  --   --  20*  GLUCOSE 115* 112*  --  103*  BUN 5* 5*  --  6  CREATININE 0.49 0.30*  --  <0.30*  CALCIUM 8.2*  --   --  7.2*  MG  --   --  2.2  --    GFR CrCl cannot be calculated (This lab value cannot be used to calculate CrCl because it is not a number: <0.30). Liver Function Tests: Recent Labs  Lab 08/05/21 0832  08/06/21 0347  AST 129* 154*  ALT 130* 157*  ALKPHOS 225* 202*  BILITOT 0.9 1.0  PROT 6.2* 6.0*  ALBUMIN 3.2* 3.2*   No results for input(s): LIPASE, AMYLASE in the last 168 hours. No results for input(s): AMMONIA in the last 168 hours. Coagulation profile Recent Labs  Lab 08/05/21 0832  INR 1.1    CBC: Recent Labs  Lab 08/05/21 0832 08/05/21 0834 08/06/21 0347 08/06/21 0849  WBC 1.5*  --  1.9* 2.0*  NEUTROABS 0.7*  --   --  1.1*  HGB 8.0* 8.5* 7.1* 7.0*  HCT 25.1* 25.0* 21.7* 21.8*  MCV 95.8  --  95.2 95.2  PLT 14*  --  14* 35*   Cardiac Enzymes: No results for input(s): CKTOTAL, CKMB, CKMBINDEX, TROPONINI in the last 168 hours. BNP (last 3 results) No results for input(s): PROBNP in the last 8760 hours. CBG: Recent Labs  Lab 08/05/21 0829  GLUCAP 109*   D-Dimer: No results for input(s): DDIMER in the last 72 hours. Hgb A1c: Recent Labs    08/06/21 0347  HGBA1C 6.4*   Lipid Profile: Recent Labs    08/06/21 0347  CHOL 132  HDL 42  LDLCALC 70  TRIG 99  CHOLHDL 3.1   Thyroid function studies: No results for input(s): TSH, T4TOTAL, T3FREE, THYROIDAB in the last 72 hours.  Invalid input(s): FREET3 Anemia work up: No results for input(s): VITAMINB12, FOLATE, FERRITIN, TIBC, IRON, RETICCTPCT in the last 72 hours. Sepsis Labs: Recent Labs  Lab 08/05/21 0832 08/06/21 0347 08/06/21 0849  WBC 1.5* 1.9* 2.0*    Microbiology Recent Results (from the past 240 hour(s))  Resp Panel by RT-PCR (Flu A&B, Covid) Nasopharyngeal Swab     Status: None   Collection Time: 08/05/21  7:32 PM   Specimen: Nasopharyngeal Swab; Nasopharyngeal(NP) swabs in vial transport medium  Result Value Ref Range Status   SARS Coronavirus 2 by RT PCR NEGATIVE NEGATIVE Final    Comment: (NOTE) SARS-CoV-2 target nucleic acids are NOT DETECTED.  The SARS-CoV-2 RNA is generally detectable in upper respiratory specimens during the acute phase of infection. The  lowest concentration of SARS-CoV-2 viral copies this assay can detect is 138 copies/mL. A negative result does not preclude SARS-Cov-2 infection and should not be used as the sole basis for treatment or other patient management decisions.  A negative result may occur with  improper specimen collection/handling, submission of specimen other than nasopharyngeal swab, presence of viral mutation(s) within the areas targeted by this assay, and inadequate number of viral copies(<138 copies/mL). A negative result must be combined with clinical observations, patient history, and epidemiological information. The expected result is Negative.  Fact Sheet for Patients:  EntrepreneurPulse.com.au  Fact Sheet for Healthcare Providers:  IncredibleEmployment.be  This test is no t yet approved or cleared by the [place] FDA and  has been authorized for detection and/or diagnosis of SARS-CoV-2 by FDA under an Emergency Use Authorization (EUA). This EUA will remain  in effect (meaning this test can be used) for the duration of the COVID-19 declaration under Section 564(b)(1) of the Act, 21 U.S.C.section 360bbb-3(b)(1), unless the authorization is terminated  or revoked sooner.       Influenza A by PCR NEGATIVE NEGATIVE Final   Influenza B by PCR NEGATIVE NEGATIVE Final    Comment: (NOTE) The Xpert Xpress SARS-CoV-2/FLU/RSV plus assay is intended as an aid in the diagnosis of influenza from Nasopharyngeal swab specimens and should not be used as a sole basis for treatment. Nasal washings and aspirates are unacceptable for Xpert Xpress SARS-CoV-2/FLU/RSV testing.  Fact Sheet for Patients: EntrepreneurPulse.com.au  Fact Sheet for Healthcare Providers: IncredibleEmployment.be  This test is not yet approved or cleared by the [place] FDA and has been authorized for detection and/or diagnosis of SARS-CoV-2 by FDA under  an Emergency Use Authorization (EUA). This EUA will remain in effect (meaning this test can be used) for the duration of the COVID-19 declaration under Section 564(b)(1) of the Act, 21 U.S.C. section 360bbb-3(b)(1), unless the authorization is terminated or revoked.  Performed at [facility], [place] [address] [place], [place] [address]   MRSA Next Gen by PCR, Nasal     Status: None   Collection Time: 08/05/21 10:16 PM   Specimen: Nasal Mucosa; Nasal Swab  Result Value Ref Range Status   MRSA by PCR Next Gen NOT DETECTED NOT DETECTED Final    Comment: (NOTE) The GeneXpert MRSA Assay (FDA approved for NASAL specimens only), is one component of a comprehensive MRSA colonization surveillance program. It is not intended to diagnose MRSA infection nor to guide or monitor treatment for MRSA infections. Test performance is not FDA approved in patients less than 32 years old. Performed at [facility], [place] [address] [place], [place] [address]     Procedures and diagnostic studies:  MR ANGIO HEAD WO CONTRAST  Result Date: 08/05/2021 CLINICAL DATA:  Neuro deficit, acute, stroke suspected; Transient ischemic attack (TIA); code stroke for left-sided weakness EXAM: MRI HEAD WITHOUT CONTRAST MRA HEAD WITHOUT CONTRAST MRA NECK WITHOUT AND WITH CONTRAST TECHNIQUE: Multiplanar, multi-echo pulse sequences of the brain and surrounding structures were acquired without intravenous contrast. Angiographic images of the Circle of Willis were acquired using MRA technique without intravenous contrast. Angiographic images of the neck were acquired using MRA technique without and with intravenous contrast. Carotid stenosis measurements (when applicable) are obtained utilizing NASCET criteria, using the distal internal carotid diameter as the denominator. CONTRAST:  43mL GADAVIST GADOBUTROL 1 MMOL/ML IV SOLN COMPARISON:  None FINDINGS: MRI HEAD Brain: There are numerous parenchymal T2  hyperintense lesions with enhancement involving the gray-white junction, central gray nuclei, cerebellum, and brainstem. Largest measures 9 mm along the right caudate. Mild associated edema. Susceptibility is present reflecting intralesional blood products or mineralization. There is suspected abnormal cortical leptomeningeal enhancement for example  within the right precentral and central sulci. Some of the cerebellar enhancement may also be leptomeningeal. No significant mass effect. There is no hydrocephalus or extra-axial fluid collection. Vascular: Major vessel flow voids at the skull base are preserved. Skull and upper cervical spine: Normal marrow signal is preserved. Sinuses/Orbits: Paranasal sinuses are aerated. Orbits are unremarkable. Other: Sella is unremarkable.  Mastoid air cells are clear. MRA HEAD Intracranial internal carotid arteries are patent. Middle and anterior cerebral arteries are patent. Intracranial vertebral arteries, basilar artery, posterior cerebral arteries are patent. Left posterior communicating artery is present. There is no significant stenosis or aneurysm. MRA NECK Common, internal, and external carotid arteries are patent. Extracranial vertebral arteries are patent. There is no hemodynamically significant stenosis. Right lung nodules. Suspect left supraclavicular and mediastinal adenopathy. IMPRESSION: Numerous subcentimeter enhancing parenchymal lesions throughout the brain with mild associated edema. Superimposed abnormal leptomeningeal enhancement. Incompletely evaluated right lung nodules and probable left supraclavicular and mediastinal adenopathy. Advanced metastatic disease is likely. No acute infarction. No large vessel occlusion or significant stenosis. Electronically Signed   By: Macy Mis M.D.   On: 08/05/2021 12:16   MR ANGIO NECK W WO CONTRAST  Result Date: 08/05/2021 CLINICAL DATA:  Neuro deficit, acute, stroke suspected; Transient ischemic attack (TIA);  code stroke for left-sided weakness EXAM: MRI HEAD WITHOUT CONTRAST MRA HEAD WITHOUT CONTRAST MRA NECK WITHOUT AND WITH CONTRAST TECHNIQUE: Multiplanar, multi-echo pulse sequences of the brain and surrounding structures were acquired without intravenous contrast. Angiographic images of the Circle of Willis were acquired using MRA technique without intravenous contrast. Angiographic images of the neck were acquired using MRA technique without and with intravenous contrast. Carotid stenosis measurements (when applicable) are obtained utilizing NASCET criteria, using the distal internal carotid diameter as the denominator. CONTRAST:  67mL GADAVIST GADOBUTROL 1 MMOL/ML IV SOLN COMPARISON:  None FINDINGS: MRI HEAD Brain: There are numerous parenchymal T2 hyperintense lesions with enhancement involving the gray-white junction, central gray nuclei, cerebellum, and brainstem. Largest measures 9 mm along the right caudate. Mild associated edema. Susceptibility is present reflecting intralesional blood products or mineralization. There is suspected abnormal cortical leptomeningeal enhancement for example within the right precentral and central sulci. Some of the cerebellar enhancement may also be leptomeningeal. No significant mass effect. There is no hydrocephalus or extra-axial fluid collection. Vascular: Major vessel flow voids at the skull base are preserved. Skull and upper cervical spine: Normal marrow signal is preserved. Sinuses/Orbits: Paranasal sinuses are aerated. Orbits are unremarkable. Other: Sella is unremarkable.  Mastoid air cells are clear. MRA HEAD Intracranial internal carotid arteries are patent. Middle and anterior cerebral arteries are patent. Intracranial vertebral arteries, basilar artery, posterior cerebral arteries are patent. Left posterior communicating artery is present. There is no significant stenosis or aneurysm. MRA NECK Common, internal, and external carotid arteries are patent. Extracranial  vertebral arteries are patent. There is no hemodynamically significant stenosis. Right lung nodules. Suspect left supraclavicular and mediastinal adenopathy. IMPRESSION: Numerous subcentimeter enhancing parenchymal lesions throughout the brain with mild associated edema. Superimposed abnormal leptomeningeal enhancement. Incompletely evaluated right lung nodules and probable left supraclavicular and mediastinal adenopathy. Advanced metastatic disease is likely. No acute infarction. No large vessel occlusion or significant stenosis. Electronically Signed   By: Macy Mis M.D.   On: 08/05/2021 12:16   MR BRAIN W WO CONTRAST  Result Date: 08/05/2021 CLINICAL DATA:  Neuro deficit, acute, stroke suspected; Transient ischemic attack (TIA); code stroke for left-sided weakness EXAM: MRI HEAD WITHOUT CONTRAST MRA HEAD WITHOUT CONTRAST MRA NECK  WITHOUT AND WITH CONTRAST TECHNIQUE: Multiplanar, multi-echo pulse sequences of the brain and surrounding structures were acquired without intravenous contrast. Angiographic images of the Circle of Willis were acquired using MRA technique without intravenous contrast. Angiographic images of the neck were acquired using MRA technique without and with intravenous contrast. Carotid stenosis measurements (when applicable) are obtained utilizing NASCET criteria, using the distal internal carotid diameter as the denominator. CONTRAST:  71mL GADAVIST GADOBUTROL 1 MMOL/ML IV SOLN COMPARISON:  None FINDINGS: MRI HEAD Brain: There are numerous parenchymal T2 hyperintense lesions with enhancement involving the gray-white junction, central gray nuclei, cerebellum, and brainstem. Largest measures 9 mm along the right caudate. Mild associated edema. Susceptibility is present reflecting intralesional blood products or mineralization. There is suspected abnormal cortical leptomeningeal enhancement for example within the right precentral and central sulci. Some of the cerebellar enhancement may  also be leptomeningeal. No significant mass effect. There is no hydrocephalus or extra-axial fluid collection. Vascular: Major vessel flow voids at the skull base are preserved. Skull and upper cervical spine: Normal marrow signal is preserved. Sinuses/Orbits: Paranasal sinuses are aerated. Orbits are unremarkable. Other: Sella is unremarkable.  Mastoid air cells are clear. MRA HEAD Intracranial internal carotid arteries are patent. Middle and anterior cerebral arteries are patent. Intracranial vertebral arteries, basilar artery, posterior cerebral arteries are patent. Left posterior communicating artery is present. There is no significant stenosis or aneurysm. MRA NECK Common, internal, and external carotid arteries are patent. Extracranial vertebral arteries are patent. There is no hemodynamically significant stenosis. Right lung nodules. Suspect left supraclavicular and mediastinal adenopathy. IMPRESSION: Numerous subcentimeter enhancing parenchymal lesions throughout the brain with mild associated edema. Superimposed abnormal leptomeningeal enhancement. Incompletely evaluated right lung nodules and probable left supraclavicular and mediastinal adenopathy. Advanced metastatic disease is likely. No acute infarction. No large vessel occlusion or significant stenosis. Electronically Signed   By: Macy Mis M.D.   On: 08/05/2021 12:16   CT HEAD CODE STROKE WO CONTRAST  Result Date: 08/05/2021 CLINICAL DATA:  Code stroke. Neuro deficit, acute, stroke suspected. Left-sided paralysis. EXAM: CT HEAD WITHOUT CONTRAST TECHNIQUE: Contiguous axial images were obtained from the base of the skull through the vertex without intravenous contrast. COMPARISON:  None. FINDINGS: Brain: The brain has normal appearance without evidence of atrophy, old or acute infarction, mass lesion, hemorrhage, hydrocephalus or extra-axial collection. Vascular: No abnormal vascular finding. Skull: Normal Sinuses/Orbits: Clear/normal Other:  None ASPECTS ([place] Stroke Program Early CT Score) - Ganglionic level infarction (caudate, lentiform nuclei, internal capsule, insula, M1-M3 cortex): 7 - Supraganglionic infarction (M4-M6 cortex): 3 Total score (0-10 with 10 being normal): 10 IMPRESSION: 1. Normal head CT. 2. ASPECTS is 10. 3. These results were communicated to Dr. Cheral Marker at 8:41 am on 08/05/2021 by text page via the [facility] messaging system. Electronically Signed   By: Nelson Chimes M.D.   On: 08/05/2021 08:42               LOS: 1 day   Maritza Goldsborough  [facility]   Pager on [email or web address]. If 7PM-7AM, please contact night-coverage at [email or web address]     08/06/2021, 3:19 PM

## 2021-08-06 NOTE — Progress Notes (Signed)
OT Cancellation Note  Patient Details Name: Shelley Pena MRN: [id number] DOB: [birth year]   Cancelled Treatment:    Reason Eval/Treat Not Completed: Patient at procedure or test/ unavailable . Vladimir Lenhoff L Yoshi Mancillas 08/06/2021, 4:06 PM

## 2021-08-06 NOTE — Progress Notes (Signed)
Patient was transferred by [facility] to [facility]

## 2021-08-06 NOTE — Progress Notes (Signed)
[facility] called and pt has a bed available, Medical Oncology unit Rm [phone]. MD notified. Called report to [employer]. Will arranged for [facility] transport.

## 2021-08-06 NOTE — Evaluation (Signed)
Clinical/Bedside Swallow Evaluation Patient Details  Name: Shelley Pena MRN: [id number] Date of Birth: Sep 30, 1986  Today's Date: 08/06/2021 Time: SLP Start Time (ACUTE ONLY): 67 SLP Stop Time (ACUTE ONLY): 1612 SLP Time Calculation (min) (ACUTE ONLY): 32 min  Past Medical History:  Past Medical History:  Diagnosis Date   Breast cancer ([place])    H/O right mastectomy    Past Surgical History:  Past Surgical History:  Procedure Laterality Date   masectomy Right    HPI:  Pt is a 35 yo female adm to [facility] with AMS, left facial asymmetry and dysarthria/confusion.  Pt has metastatic breast cancer (started chemo 2 years ago)  that is being managed by [facility], s/p right mastectomy and has been on chemotherapy. Beds are full at [facility] thus she was tx to [facility]. MRI on admission showed numerous, subcentimeter enhancing parenchymal lesions throughout the brain with associated edema. She wishes to continue her care at [facility] but unfortunately, there were no beds available so she has been admitted to [facility] for medical management until a bed becomes available for transfer to [facility]. She has been started on Decadron 4mg  q6h and Keppra for seizure prophylaxis which is appropriate.  Per imaging, Incompletely evaluated right lung  nodules and probable left supraclavicular and mediastinal  adenopathy. Advanced metastatic disease is likely.  Speech and swallow evaluation ordered.  Per pt's RN from earlier today, pt was being given thickened liquids at home prior to admission.    Assessment / Plan / Recommendation  Clinical Impression  Pt presents with clinical indications concerning for aspiration of thin liquids c/b immediate cough post-swallow. Pt reports this happens if she is not "careful".  No focal CN deficits apparent but pt has whispered voice - concerning for RLN involvement.  Pt with excellent tolerance of solids. Ms [place] is observed to use significant caution when consuming po evidenced by her  consistent piecemealing.  Chin tuck posturing trialed - and pt reports this to be helpful - however clinically this can not be confirmed to be helpful.  After discussion with pt and father, will modify diet to nectar thick liquids to allow her to order nectar thickened water *she does not consume sugar* and will proceed with MBS next date.  SLP suspects pt may have decreased oral control - allowing premature spillage of liquids into open airway and/or compromised laryngeal closure allowing aspiration.  Pt and father agreeable to plan. SLP messaged MD and RN re: plan for pt. SLP Visit Diagnosis: Dysphagia, unspecified (R13.10)    Aspiration Risk  Mild aspiration risk    Diet Recommendation     Medication Administration: Other (Comment) (as tolerated) Supervision: Patient able to self feed Compensations: Slow rate;Small sips/bites    Other  Recommendations      Recommendations for follow up therapy are one component of a multi-disciplinary discharge planning process, led by the attending physician.  Recommendations may be updated based on patient status, additional functional criteria and insurance authorization.  Follow up Recommendations        Frequency and Duration min 1 x/week  1 week       Prognosis Prognosis for Safe Diet Advancement: Good      Swallow Study   General HPI: Pt is a 35 yo female adm to [facility] with AMS, left facial asymmetry and dysarthria/confusion.  Pt has metastatic breast cancer (started chemo 2 years ago)  that is being managed by [facility], s/p right mastectomy and has been on chemotherapy. Beds are full at  [facility] thus she was tx to [facility]. MRI on admission showed numerous, subcentimeter enhancing parenchymal lesions throughout the brain with associated edema. She wishes to continue her care at [facility] but unfortunately, there were no beds available so she has been admitted to [facility] for medical management until a bed becomes available for transfer to [facility]. She  has been started on Decadron 4mg  q6h and Keppra for seizure prophylaxis which is appropriate.  Per imaging, Incompletely evaluated right lung  nodules and probable left supraclavicular and mediastinal  adenopathy. Advanced metastatic disease is likely.  Speech and swallow evaluation ordered.  Per pt's RN from earlier today, pt was being given thickened liquids at home prior to admission. Type of Study: Bedside Swallow Evaluation Diet Prior to this Study: Regular;Thin liquids Temperature Spikes Noted: No Respiratory Status: Room air History of Recent Intubation: No Behavior/Cognition: Alert;Cooperative;Pleasant mood Oral Cavity Assessment: Within Functional Limits Oral Care Completed by SLP: No Oral Cavity - Dentition: Adequate natural dentition Vision: Functional for self-feeding Self-Feeding Abilities: Able to feed self Patient Positioning: Upright in bed Baseline Vocal Quality: Breathy Volitional Cough: Weak    Oral/Motor/Sensory Function Overall Oral Motor/Sensory Function: Within functional limits   Ice Chips Ice chips: Not tested   Thin Liquid Thin Liquid: Impaired Pharyngeal  Phase Impairments: Cough - Immediate;Multiple swallows Other Comments: cough with liquids between nectar and thin x1 - which pt reports occurs when she is not "concentrating"    Nectar Thick Nectar Thick Liquid: Within functional limits Presentation: Straw;Self Fed Pharyngeal Phase Impairments: Multiple swallows   Honey Thick Honey Thick Liquid: Not tested   Puree Puree: Not tested   Solid     Solid: Within functional limits Presentation: Self Fed;Spoon      Shelley Pena 08/06/2021,4:59 PM  Kathleen Lime, MS [place] Office [phone] Pager [phone]

## 2021-08-06 NOTE — Discharge Summary (Signed)
Physician Discharge Summary  Shelley Pena URK:[id number] DOB: 12-May-1986 DOA: 08/05/2021  PCP: Pcp, No  Admit date: 08/05/2021 Discharge date: 08/06/2021  Discharge disposition: [facility]   Recommendations for Outpatient Follow-Up:   Follow-up with oncologist at [facility]  Discharge Diagnosis:   Principal Problem:   Brain metastases [facility]) Active Problems:   Breast cancer ([place])   Pancytopenia ([place])    Discharge Condition: Stable.  Diet recommendation:  Diet Order             Diet regular Room service appropriate? Yes; Fluid consistency: Nectar Thick  Diet effective now                     Code Status: Full Code     Hospital Course:   Ms. Santos Hardwick is a 35 y.o. 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  MRI brain showed brain metastasis but no evidence of acute stroke.  She was admitted to the hospital for breast cancer with metastasis to the brain and pancytopenia.  She was treated with IV Keppra and IV Decadron.  She was also treated with 1 unit of platelets for severe thrombocytopenia.  Dr. Roosevelt Locks, admitting physician, had discussed the case with Dr. Fanny Skates, oncologist at [facility], who graciously accepted the patient in transfer.     Discharge Exam:    Vitals:   08/06/21 0900 08/06/21 1000 08/06/21 1031 08/06/21 1616  BP: (!) 137/100 (!) 124/93 (!) 122/91 (!) 135/98  Pulse: (!) 114  (!) 131 (!) 131  Resp: (!) 23 (!) 28 18 20   Temp:   97.8 F (36.6 C) 97.8 F (36.6 C)  TempSrc:   Oral Oral  SpO2: 100%  100% 100%  Weight:      Height:         GEN: NAD SKIN: Warm and dry EYES: EOMI ENT: MMM, voice is soft and hoarse CV: RRR PULM: CTA B ABD: soft, ND, NT, +BS CNS: AAO x 3, non focal EXT: No edema or tenderness        The results of significant diagnostics  from this hospitalization (including imaging, microbiology, ancillary and laboratory) are listed below for reference.     Procedures and Diagnostic Studies:   MR ANGIO HEAD WO CONTRAST  Result Date: 08/05/2021 CLINICAL DATA:  Neuro deficit, acute, stroke suspected; Transient ischemic attack (TIA); code stroke for left-sided weakness EXAM: MRI HEAD WITHOUT CONTRAST MRA HEAD WITHOUT CONTRAST MRA NECK WITHOUT AND WITH CONTRAST TECHNIQUE: Multiplanar, multi-echo pulse sequences of the brain and surrounding structures were acquired without intravenous contrast. Angiographic images of the Circle of Willis were acquired using MRA technique without intravenous contrast. Angiographic images of the neck were acquired using MRA technique without and with intravenous contrast. Carotid stenosis measurements (when applicable) are obtained utilizing NASCET criteria, using the distal internal carotid diameter as the denominator. CONTRAST:  80mL GADAVIST GADOBUTROL 1 MMOL/ML IV SOLN COMPARISON:  None FINDINGS: MRI HEAD Brain: There are numerous parenchymal T2 hyperintense lesions with enhancement involving the gray-white junction, central gray nuclei, cerebellum, and brainstem. Largest measures 9 mm along the right caudate. Mild associated edema. Susceptibility is present reflecting intralesional blood products or mineralization. There is suspected abnormal cortical leptomeningeal enhancement for example within the right precentral and central sulci. Some of the cerebellar enhancement may also be leptomeningeal. No significant mass effect. There is no hydrocephalus or extra-axial  fluid collection. Vascular: Major vessel flow voids at the skull base are preserved. Skull and upper cervical spine: Normal marrow signal is preserved. Sinuses/Orbits: Paranasal sinuses are aerated. Orbits are unremarkable. Other: Sella is unremarkable.  Mastoid air cells are clear. MRA HEAD Intracranial internal carotid arteries are patent. Middle  and anterior cerebral arteries are patent. Intracranial vertebral arteries, basilar artery, posterior cerebral arteries are patent. Left posterior communicating artery is present. There is no significant stenosis or aneurysm. MRA NECK Common, internal, and external carotid arteries are patent. Extracranial vertebral arteries are patent. There is no hemodynamically significant stenosis. Right lung nodules. Suspect left supraclavicular and mediastinal adenopathy. IMPRESSION: Numerous subcentimeter enhancing parenchymal lesions throughout the brain with mild associated edema. Superimposed abnormal leptomeningeal enhancement. Incompletely evaluated right lung nodules and probable left supraclavicular and mediastinal adenopathy. Advanced metastatic disease is likely. No acute infarction. No large vessel occlusion or significant stenosis. Electronically Signed   By: Macy Mis M.D.   On: 08/05/2021 12:16   MR ANGIO NECK W WO CONTRAST  Result Date: 08/05/2021 CLINICAL DATA:  Neuro deficit, acute, stroke suspected; Transient ischemic attack (TIA); code stroke for left-sided weakness EXAM: MRI HEAD WITHOUT CONTRAST MRA HEAD WITHOUT CONTRAST MRA NECK WITHOUT AND WITH CONTRAST TECHNIQUE: Multiplanar, multi-echo pulse sequences of the brain and surrounding structures were acquired without intravenous contrast. Angiographic images of the Circle of Willis were acquired using MRA technique without intravenous contrast. Angiographic images of the neck were acquired using MRA technique without and with intravenous contrast. Carotid stenosis measurements (when applicable) are obtained utilizing NASCET criteria, using the distal internal carotid diameter as the denominator. CONTRAST:  13mL GADAVIST GADOBUTROL 1 MMOL/ML IV SOLN COMPARISON:  None FINDINGS: MRI HEAD Brain: There are numerous parenchymal T2 hyperintense lesions with enhancement involving the gray-white junction, central gray nuclei, cerebellum, and brainstem.  Largest measures 9 mm along the right caudate. Mild associated edema. Susceptibility is present reflecting intralesional blood products or mineralization. There is suspected abnormal cortical leptomeningeal enhancement for example within the right precentral and central sulci. Some of the cerebellar enhancement may also be leptomeningeal. No significant mass effect. There is no hydrocephalus or extra-axial fluid collection. Vascular: Major vessel flow voids at the skull base are preserved. Skull and upper cervical spine: Normal marrow signal is preserved. Sinuses/Orbits: Paranasal sinuses are aerated. Orbits are unremarkable. Other: Sella is unremarkable.  Mastoid air cells are clear. MRA HEAD Intracranial internal carotid arteries are patent. Middle and anterior cerebral arteries are patent. Intracranial vertebral arteries, basilar artery, posterior cerebral arteries are patent. Left posterior communicating artery is present. There is no significant stenosis or aneurysm. MRA NECK Common, internal, and external carotid arteries are patent. Extracranial vertebral arteries are patent. There is no hemodynamically significant stenosis. Right lung nodules. Suspect left supraclavicular and mediastinal adenopathy. IMPRESSION: Numerous subcentimeter enhancing parenchymal lesions throughout the brain with mild associated edema. Superimposed abnormal leptomeningeal enhancement. Incompletely evaluated right lung nodules and probable left supraclavicular and mediastinal adenopathy. Advanced metastatic disease is likely. No acute infarction. No large vessel occlusion or significant stenosis. Electronically Signed   By: Macy Mis M.D.   On: 08/05/2021 12:16   MR BRAIN W WO CONTRAST  Result Date: 08/05/2021 CLINICAL DATA:  Neuro deficit, acute, stroke suspected; Transient ischemic attack (TIA); code stroke for left-sided weakness EXAM: MRI HEAD WITHOUT CONTRAST MRA HEAD WITHOUT CONTRAST MRA NECK WITHOUT AND WITH CONTRAST  TECHNIQUE: Multiplanar, multi-echo pulse sequences of the brain and surrounding structures were acquired without intravenous contrast. Angiographic images of the Circle  of Willis were acquired using MRA technique without intravenous contrast. Angiographic images of the neck were acquired using MRA technique without and with intravenous contrast. Carotid stenosis measurements (when applicable) are obtained utilizing NASCET criteria, using the distal internal carotid diameter as the denominator. CONTRAST:  35mL GADAVIST GADOBUTROL 1 MMOL/ML IV SOLN COMPARISON:  None FINDINGS: MRI HEAD Brain: There are numerous parenchymal T2 hyperintense lesions with enhancement involving the gray-white junction, central gray nuclei, cerebellum, and brainstem. Largest measures 9 mm along the right caudate. Mild associated edema. Susceptibility is present reflecting intralesional blood products or mineralization. There is suspected abnormal cortical leptomeningeal enhancement for example within the right precentral and central sulci. Some of the cerebellar enhancement may also be leptomeningeal. No significant mass effect. There is no hydrocephalus or extra-axial fluid collection. Vascular: Major vessel flow voids at the skull base are preserved. Skull and upper cervical spine: Normal marrow signal is preserved. Sinuses/Orbits: Paranasal sinuses are aerated. Orbits are unremarkable. Other: Sella is unremarkable.  Mastoid air cells are clear. MRA HEAD Intracranial internal carotid arteries are patent. Middle and anterior cerebral arteries are patent. Intracranial vertebral arteries, basilar artery, posterior cerebral arteries are patent. Left posterior communicating artery is present. There is no significant stenosis or aneurysm. MRA NECK Common, internal, and external carotid arteries are patent. Extracranial vertebral arteries are patent. There is no hemodynamically significant stenosis. Right lung nodules. Suspect left supraclavicular  and mediastinal adenopathy. IMPRESSION: Numerous subcentimeter enhancing parenchymal lesions throughout the brain with mild associated edema. Superimposed abnormal leptomeningeal enhancement. Incompletely evaluated right lung nodules and probable left supraclavicular and mediastinal adenopathy. Advanced metastatic disease is likely. No acute infarction. No large vessel occlusion or significant stenosis. Electronically Signed   By: Macy Mis M.D.   On: 08/05/2021 12:16   ECHOCARDIOGRAM COMPLETE  Result Date: 08/06/2021    ECHOCARDIOGRAM REPORT   Patient Name:   MYANGEL SUMMONS Date of Exam: 08/06/2021 Medical Rec #:  [id number]    Height:       63.0 in Accession #:    [id number]   Weight:       117.3 lb Date of Birth:  12-Jul-1986    BSA:          1.541 m Patient Age:    34 years     BP:           140/101 mmHg Patient Gender: F            HR:           111 bpm. Exam Location:  Inpatient Procedure: 2D Echo, Cardiac Doppler and Color Doppler Indications:    CVA  History:        Patient has no prior history of Echocardiogram examinations.  Sonographer:    Helmut Muster Referring Phys: [id number] [place]  1. Left ventricular ejection fraction, by estimation, is 55 to 60%. The left ventricle has normal function. The left ventricle has no regional wall motion abnormalities. Left ventricular diastolic parameters are indeterminate.  2. Right ventricular systolic function is normal. The right ventricular size is normal. Tricuspid regurgitation signal is inadequate for assessing PA pressure.  3. The mitral valve is normal in structure. No evidence of mitral valve regurgitation. No evidence of mitral stenosis.  4. The aortic valve was not well visualized. Aortic valve regurgitation is not visualized. No aortic stenosis is present.  5. The inferior vena cava is normal in size with greater than 50% respiratory variability, suggesting right atrial pressure of 3 mmHg. FINDINGS  Left Ventricle: Left ventricular  ejection fraction, by estimation, is 55 to 60%. The left ventricle has normal function. The left ventricle has no regional wall motion abnormalities. The left ventricular internal cavity size was normal in size. There is  no left ventricular hypertrophy. Left ventricular diastolic parameters are indeterminate. Right Ventricle: The right ventricular size is normal. No increase in right ventricular wall thickness. Right ventricular systolic function is normal. Tricuspid regurgitation signal is inadequate for assessing PA pressure. The tricuspid regurgitant velocity is 1.05 m/s, and with an assumed right atrial pressure of 3 mmHg, the estimated right ventricular systolic pressure is 7.4 mmHg. Left Atrium: Left atrial size was normal in size. Right Atrium: Right atrial size was normal in size. Pericardium: Trivial pericardial effusion is present. Mitral Valve: The mitral valve is normal in structure. No evidence of mitral valve regurgitation. No evidence of mitral valve stenosis. Tricuspid Valve: The tricuspid valve is normal in structure. Tricuspid valve regurgitation is trivial. Aortic Valve: The aortic valve was not well visualized. Aortic valve regurgitation is not visualized. No aortic stenosis is present. Pulmonic Valve: The pulmonic valve was normal in structure. Pulmonic valve regurgitation is not visualized. Aorta: The aortic root is normal in size and structure. Venous: The inferior vena cava is normal in size with greater than 50% respiratory variability, suggesting right atrial pressure of 3 mmHg. IAS/Shunts: No atrial level shunt detected by color flow Doppler.  LEFT VENTRICLE PLAX 2D LVIDd:         3.30 cm     Diastology LVIDs:         2.20 cm     LV e' medial:  9.68 cm/s LV PW:         0.90 cm     LV e' lateral: 9.14 cm/s LV IVS:        0.80 cm LVOT diam:     1.60 cm LV SV:         30 LV SV Index:   19 LVOT Area:     2.01 cm  LV Volumes (MOD) LV vol d, MOD A2C: 49.9 ml LV vol d, MOD A4C: 41.5 ml LV vol  s, MOD A2C: 19.8 ml LV vol s, MOD A4C: 16.6 ml LV SV MOD A2C:     30.1 ml LV SV MOD A4C:     41.5 ml LV SV MOD BP:      26.7 ml RIGHT VENTRICLE             IVC RV S prime:     17.50 cm/s  IVC diam: 1.90 cm TAPSE (M-mode): 2.2 cm LEFT ATRIUM             Index       RIGHT ATRIUM          Index LA diam:        1.90 cm 1.23 cm/m  RA Area:     9.16 cm LA Vol (A2C):   11.8 ml 7.66 ml/m  RA Volume:   20.50 ml 13.30 ml/m LA Vol (A4C):   9.7 ml  6.30 ml/m LA Biplane Vol: 10.7 ml 6.94 ml/m  AORTIC VALVE LVOT Vmax:   85.70 cm/s LVOT Vmean:  51.500 cm/s LVOT VTI:    0.148 m  AORTA Ao Root diam: 2.80 cm Ao Asc diam:  3.10 cm TRICUSPID VALVE TR Peak grad:   4.4 mmHg TR Vmax:        105.00 cm/s  SHUNTS Systemic VTI:  0.15 m Systemic Diam:  1.60 cm Dalton McleanMD Electronically signed by Franki Monte Signature Date/Time: 08/06/2021/3:28:38 PM    Final    CT HEAD CODE STROKE WO CONTRAST  Result Date: 08/05/2021 CLINICAL DATA:  Code stroke. Neuro deficit, acute, stroke suspected. Left-sided paralysis. EXAM: CT HEAD WITHOUT CONTRAST TECHNIQUE: Contiguous axial images were obtained from the base of the skull through the vertex without intravenous contrast. COMPARISON:  None. FINDINGS: Brain: The brain has normal appearance without evidence of atrophy, old or acute infarction, mass lesion, hemorrhage, hydrocephalus or extra-axial collection. Vascular: No abnormal vascular finding. Skull: Normal Sinuses/Orbits: Clear/normal Other: None ASPECTS ([place] Stroke Program Early CT Score) - Ganglionic level infarction (caudate, lentiform nuclei, internal capsule, insula, M1-M3 cortex): 7 - Supraganglionic infarction (M4-M6 cortex): 3 Total score (0-10 with 10 being normal): 10 IMPRESSION: 1. Normal head CT. 2. ASPECTS is 10. 3. These results were communicated to Dr. Cheral Marker at 8:41 am on 08/05/2021 by text page via the [facility] messaging system. Electronically Signed   By: Nelson Chimes M.D.   On: 08/05/2021 08:42     Labs:    Basic Metabolic Panel: Recent Labs  Lab 08/05/21 0832 08/05/21 0834 08/05/21 2206 08/06/21 0347  NA 129* 130*  --  128*  K 3.4* 3.6  --  3.7  CL 95* 93*  --  99  CO2 23  --   --  20*  GLUCOSE 115* 112*  --  103*  BUN 5* 5*  --  6  CREATININE 0.49 0.30*  --  <0.30*  CALCIUM 8.2*  --   --  7.2*  MG  --   --  2.2  --    GFR CrCl cannot be calculated (This lab value cannot be used to calculate CrCl because it is not a number: <0.30). Liver Function Tests: Recent Labs  Lab 08/05/21 0832 08/06/21 0347  AST 129* 154*  ALT 130* 157*  ALKPHOS 225* 202*  BILITOT 0.9 1.0  PROT 6.2* 6.0*  ALBUMIN 3.2* 3.2*   No results for input(s): LIPASE, AMYLASE in the last 168 hours. No results for input(s): AMMONIA in the last 168 hours. Coagulation profile Recent Labs  Lab 08/05/21 0832  INR 1.1    CBC: Recent Labs  Lab 08/05/21 0832 08/05/21 0834 08/06/21 0347 08/06/21 0849  WBC 1.5*  --  1.9* 2.0*  NEUTROABS 0.7*  --   --  1.1*  HGB 8.0* 8.5* 7.1* 7.0*  HCT 25.1* 25.0* 21.7* 21.8*  MCV 95.8  --  95.2 95.2  PLT 14*  --  14* 35*   Cardiac Enzymes: No results for input(s): CKTOTAL, CKMB, CKMBINDEX, TROPONINI in the last 168 hours. BNP: Invalid input(s): POCBNP CBG: Recent Labs  Lab 08/05/21 0829  GLUCAP 109*   D-Dimer No results for input(s): DDIMER in the last 72 hours. Hgb A1c Recent Labs    08/06/21 0347  HGBA1C 6.4*   Lipid Profile Recent Labs    08/06/21 0347  CHOL 132  HDL 42  LDLCALC 70  TRIG 99  CHOLHDL 3.1   Thyroid function studies No results for input(s): TSH, T4TOTAL, T3FREE, THYROIDAB in the last 72 hours.  Invalid input(s): FREET3 Anemia work up No results for input(s): VITAMINB12, FOLATE, FERRITIN, TIBC, IRON, RETICCTPCT in the last 72 hours. Microbiology Recent Results (from the past 240 hour(s))  Resp Panel by RT-PCR (Flu A&B, Covid) Nasopharyngeal Swab     Status: None   Collection Time: 08/05/21  7:32 PM   Specimen:  Nasopharyngeal Swab; Nasopharyngeal(NP) swabs in vial  transport medium  Result Value Ref Range Status   SARS Coronavirus 2 by RT PCR NEGATIVE NEGATIVE Final    Comment: (NOTE) SARS-CoV-2 target nucleic acids are NOT DETECTED.  The SARS-CoV-2 RNA is generally detectable in upper respiratory specimens during the acute phase of infection. The lowest concentration of SARS-CoV-2 viral copies this assay can detect is 138 copies/mL. A negative result does not preclude SARS-Cov-2 infection and should not be used as the sole basis for treatment or other patient management decisions. A negative result may occur with  improper specimen collection/handling, submission of specimen other than nasopharyngeal swab, presence of viral mutation(s) within the areas targeted by this assay, and inadequate number of viral copies(<138 copies/mL). A negative result must be combined with clinical observations, patient history, and epidemiological information. The expected result is Negative.  Fact Sheet for Patients:  EntrepreneurPulse.com.au  Fact Sheet for Healthcare Providers:  IncredibleEmployment.be  This test is no t yet approved or cleared by the [place] FDA and  has been authorized for detection and/or diagnosis of SARS-CoV-2 by FDA under an Emergency Use Authorization (EUA). This EUA will remain  in effect (meaning this test can be used) for the duration of the COVID-19 declaration under Section 564(b)(1) of the Act, 21 U.S.C.section 360bbb-3(b)(1), unless the authorization is terminated  or revoked sooner.       Influenza A by PCR NEGATIVE NEGATIVE Final   Influenza B by PCR NEGATIVE NEGATIVE Final    Comment: (NOTE) The Xpert Xpress SARS-CoV-2/FLU/RSV plus assay is intended as an aid in the diagnosis of influenza from Nasopharyngeal swab specimens and should not be used as a sole basis for treatment. Nasal washings and aspirates are unacceptable for  Xpert Xpress SARS-CoV-2/FLU/RSV testing.  Fact Sheet for Patients: EntrepreneurPulse.com.au  Fact Sheet for Healthcare Providers: IncredibleEmployment.be  This test is not yet approved or cleared by the [place] FDA and has been authorized for detection and/or diagnosis of SARS-CoV-2 by FDA under an Emergency Use Authorization (EUA). This EUA will remain in effect (meaning this test can be used) for the duration of the COVID-19 declaration under Section 564(b)(1) of the Act, 21 U.S.C. section 360bbb-3(b)(1), unless the authorization is terminated or revoked.  Performed at [facility], [place] [address] [place], [place] [address]   MRSA Next Gen by PCR, Nasal     Status: None   Collection Time: 08/05/21 10:16 PM   Specimen: Nasal Mucosa; Nasal Swab  Result Value Ref Range Status   MRSA by PCR Next Gen NOT DETECTED NOT DETECTED Final    Comment: (NOTE) The GeneXpert MRSA Assay (FDA approved for NASAL specimens only), is one component of a comprehensive MRSA colonization surveillance program. It is not intended to diagnose MRSA infection nor to guide or monitor treatment for MRSA infections. Test performance is not FDA approved in patients less than 22 years old. Performed at [facility], [place], [place] [address] [place], [place] [address]      Discharge Instructions:   Discharge Instructions     Increase activity slowly   Complete by: As directed    No wound care   Complete by: As directed       Allergies as of 08/06/2021   No Known Allergies      Medication List     TAKE these medications    cyclobenzaprine 5 MG tablet Commonly known as: FLEXERIL Take 5 mg by mouth 2 (two) times daily.   famotidine 20 MG tablet Commonly known as:  PEPCID Take 20 mg by mouth 2 (two) times daily before a meal.   HYDROmorphone 2 MG tablet Commonly known as: DILAUDID Take 2-4 mg by mouth every 4 (four) hours as  needed (for pain).   OLANZapine 2.5 MG tablet Commonly known as: ZYPREXA Take 2.5 mg by mouth at bedtime.   potassium chloride SA 20 MEQ tablet Commonly known as: KLOR-CON Take 20 mEq by mouth daily.   Stool Softener Plus Laxative 8.6-50 MG tablet Generic drug: senna-docusate Take 1 tablet by mouth daily.           If you experience worsening of your admission symptoms, develop shortness of breath, life threatening emergency, suicidal or homicidal thoughts you must seek medical attention immediately by calling 911 or calling your MD immediately  if symptoms less severe.   You must read complete instructions/literature along with all the possible adverse reactions/side effects for all the medicines you take and that have been prescribed to you. Take any new medicines after you have completely understood and accept all the possible adverse reactions/side effects.    Please note   You were cared for by a hospitalist during your hospital stay. If you have any questions about your discharge medications or the care you received while you were in the hospital after you are discharged, you can call the unit and asked to speak with the hospitalist on call if the hospitalist that took care of you is not available. Once you are discharged, your primary care physician will handle any further medical issues. Please note that NO REFILLS for any discharge medications will be authorized once you are discharged, as it is imperative that you return to your primary care physician (or establish a relationship with a primary care physician if you do not have one) for your aftercare needs so that they can reassess your need for medications and monitor your lab values.       Time coordinating discharge: 38 minutes  Signed:  Kurtiss Wence  [facility] 08/06/2021, 5:55 PM   Pager on [email or web address]. If 7PM-7AM, please contact night-coverage at [email or web address]

## 2021-08-06 NOTE — Progress Notes (Signed)
Called [facility] to arrange transport for pt going to [facility].

## 2021-08-07 LAB — PREPARE PLATELET PHERESIS: Unit division: 0

## 2021-08-07 LAB — BPAM PLATELET PHERESIS
Blood Product Expiration Date: 202210222359
ISSUE DATE / TIME: 202210200333
Unit Type and Rh: 5100

## 2021-08-07 LAB — PATHOLOGIST SMEAR REVIEW

## 2021-08-10 ENCOUNTER — Inpatient Hospital Stay: Payer: BC Managed Care – PPO | Attending: Radiation Oncology
